# Patient Record
Sex: Female | Born: 1985 | Race: Black or African American | Hispanic: No | Marital: Single | Smoking: Former smoker
Health system: Southern US, Community
[De-identification: ages and names within clinical notes are randomized; demographics above are authoritative.]

## PROBLEM LIST (undated history)

## (undated) DIAGNOSIS — O1490 Unspecified pre-eclampsia, unspecified trimester: Secondary | ICD-10-CM

## (undated) DIAGNOSIS — IMO0001 Reserved for inherently not codable concepts without codable children: Secondary | ICD-10-CM

## (undated) DIAGNOSIS — R5383 Other fatigue: Secondary | ICD-10-CM

## (undated) DIAGNOSIS — R030 Elevated blood-pressure reading, without diagnosis of hypertension: Secondary | ICD-10-CM

## (undated) DIAGNOSIS — M5430 Sciatica, unspecified side: Secondary | ICD-10-CM

## (undated) DIAGNOSIS — J302 Other seasonal allergic rhinitis: Secondary | ICD-10-CM

## (undated) DIAGNOSIS — R011 Cardiac murmur, unspecified: Secondary | ICD-10-CM

## (undated) HISTORY — PX: TONSILLECTOMY: SUR1361

## (undated) HISTORY — DX: Other fatigue: R53.83

## (undated) HISTORY — DX: Reserved for inherently not codable concepts without codable children: IMO0001

## (undated) HISTORY — DX: Other seasonal allergic rhinitis: J30.2

## (undated) HISTORY — DX: Cardiac murmur, unspecified: R01.1

## (undated) HISTORY — DX: Unspecified pre-eclampsia, unspecified trimester: O14.90

## (undated) HISTORY — PX: LAPAROSCOPY: SHX197

## (undated) HISTORY — DX: Elevated blood-pressure reading, without diagnosis of hypertension: R03.0

## (undated) HISTORY — DX: Sciatica, unspecified side: M54.30

## (undated) HISTORY — PX: KNEE ARTHROSCOPY: SUR90

---

## 2006-04-18 ENCOUNTER — Emergency Department (HOSPITAL_COMMUNITY): Admission: EM | Admit: 2006-04-18 | Discharge: 2006-04-18 | Payer: Self-pay | Admitting: Emergency Medicine

## 2006-05-12 ENCOUNTER — Ambulatory Visit (HOSPITAL_COMMUNITY): Admission: RE | Admit: 2006-05-12 | Discharge: 2006-05-12 | Payer: Self-pay | Admitting: Obstetrics

## 2006-07-26 ENCOUNTER — Inpatient Hospital Stay (HOSPITAL_COMMUNITY): Admission: AD | Admit: 2006-07-26 | Discharge: 2006-07-26 | Payer: Self-pay | Admitting: Obstetrics & Gynecology

## 2006-09-22 ENCOUNTER — Inpatient Hospital Stay (HOSPITAL_COMMUNITY): Admission: AD | Admit: 2006-09-22 | Discharge: 2006-09-27 | Payer: Self-pay | Admitting: Obstetrics

## 2006-09-24 ENCOUNTER — Encounter (INDEPENDENT_AMBULATORY_CARE_PROVIDER_SITE_OTHER): Payer: Self-pay | Admitting: Specialist

## 2006-09-24 DIAGNOSIS — O1490 Unspecified pre-eclampsia, unspecified trimester: Secondary | ICD-10-CM

## 2007-04-13 ENCOUNTER — Emergency Department (HOSPITAL_COMMUNITY): Admission: EM | Admit: 2007-04-13 | Discharge: 2007-04-13 | Payer: Self-pay | Admitting: Family Medicine

## 2008-09-18 ENCOUNTER — Ambulatory Visit (HOSPITAL_COMMUNITY): Admission: RE | Admit: 2008-09-18 | Discharge: 2008-09-18 | Payer: Self-pay | Admitting: Obstetrics

## 2008-10-01 ENCOUNTER — Emergency Department (HOSPITAL_COMMUNITY): Admission: EM | Admit: 2008-10-01 | Discharge: 2008-10-01 | Payer: Self-pay | Admitting: Family Medicine

## 2008-10-29 ENCOUNTER — Emergency Department (HOSPITAL_COMMUNITY): Admission: EM | Admit: 2008-10-29 | Discharge: 2008-10-29 | Payer: Self-pay | Admitting: Family Medicine

## 2011-04-03 NOTE — Op Note (Signed)
Carla Patel, Carla Patel                 ACCOUNT NO.:  [id number]   MEDICAL RECORD NO.:  [id number]          PATIENT TYPE:  INP   LOCATION:  9113                          FACILITY:  [facility]   PHYSICIAN:  Charles A. Clearance Coots, M.D.DATE OF BIRTH:  [birth year]   DATE OF PROCEDURE:  09/24/2006  DATE OF DISCHARGE:  09/27/2006                                 OPERATIVE REPORT   PREOPERATIVE DIAGNOSES:  1. Term pregnancy.  2. Severe preeclampsia.  3. Failed induction of labor.   POSTOPERATIVE DIAGNOSES:  1. Term pregnancy.  2. Severe preeclampsia.  3. Failed induction of labor.   PROCEDURE:  Primary low transverse cesarean section.   SURGEON:  Coral Ceo, MD   ASSISTANT:  Antionette Char, MD   ANESTHESIA:  Spinal.   ESTIMATED BLOOD LOSS:  1000 mL.   COMPLICATIONS:  None.   DRAINS:  Foley to gravity.   FINDINGS:  A viable female, weight of 5 pounds 5 ounces, length of 19-1/4  inches.  Normal uterus, ovaries and fallopian tubes.   OPERATION:  The patient was brought to the operating room and after  satisfactory spinal anesthesia, the abdomen was prepped and draped in the  usual sterile fashion.  A Pfannenstiel skin incision was made with a scalpel  that was deepened down to the fascia with a scalpel.  The fascia was nicked  in the midline and the fascial incision was extended to the left and to the  right with curved Mayo scissors.  The superior and inferior fascial edges  were taken off of the rectus muscles with both blunt and sharp dissection.  The rectus muscle was bluntly and sharply divided in the midline and the  peritoneum was entered digitally and was digitally extended to the left and  to the right.  The bladder blade was positioned and the vesicouterine fold  of peritoneum above the reflection of the urinary bladder was grasped with  forceps and was incised and undermined with Metzenbaum scissors.  The  incision was extended to the left and to the right with the Metzenbaum  scissors.  The bladder flap was developed and the bladder blade was  repositioned in front of the urinary bladder, placing it well out of  operative field.  The uterus was then entered transversely in the lower  uterine segment with a scalpel; clear amniotic fluid was expelled.  The  uterine incision was extended to the left and to the right with the bandage  scissors.  The occiput was then brought up into the incision and the vertex  was delivered with the aid of fundal pressure from the assistant.  The  infant's mouth and nose were suctioned with a suction bulb and the delivery  was completed with the aid of fundal pressure from the assistant.  The  umbilical cord was doubly clamped and cut and the infant was handed off to  the nursery staff.  Cord blood was obtained.  The placenta was then  spontaneously expelled from the uterine cavity intact and submitted to  Pathology for evaluation.  The endometrial surface was thoroughly debrided  with a dry lap sponge.  The edges of the uterine incision were grasped with  ring forceps and the uterus was closed in 2 layers; the first layer was  closed with a continuous interlocking suture of 0 Monocryl; the second layer  was closed with a continuous imbricating suture of 0 Monocryl.  Hemostasis  was excellent.  The bladder flap was then closed with a continuous suture of  3-0 Monocryl.  The pelvic cavity was thoroughly irrigated with warm saline  solution.  All clots were removed.  The abdomen was then closed as follows:  The peritoneum was closed with a continuous suture of 2-0 Monocryl, the  muscle was approximated with interrupted sutures of 2-0 Monocryl, fascia was  closed with a continuous suture of 0 Vicryl, the subcutaneous tissue was  thoroughly irrigated with warm saline solution and all areas of subcutaneous  bleeding were coagulated with the Bovie.  The skin was then closed with a  continuous subcuticular suture of 3-0 Monocryl.  A sterile  bandage was  applied to the incision closure.  The surgical technician indicated that all  needle, sponge and instrument counts were correct x2.  The patient tolerated  the procedure well and  transported to the recovery room in satisfactory  condition.      Charles A. Clearance Coots, M.D.  Electronically Signed     CAH/MEDQ  D:  09/28/2006  T:  09/29/2006  Job:  [id number]

## 2011-04-03 NOTE — Discharge Summary (Signed)
Carla Patel, MALSON NO.:  [id number]   MEDICAL RECORD NO.:  [id number]          PATIENT TYPE:  INP   LOCATION:  9113                          FACILITY:  [facility]   PHYSICIAN:  Roseanna Rainbow, M.D.DATE OF BIRTH:  1986-08-07   DATE OF ADMISSION:  09/22/2006  DATE OF DISCHARGE:  09/27/2006                               DISCHARGE SUMMARY   CHIEF COMPLAINT:  The patient is a 25 year old, G1, African American  female with an estimated date of confinement of November17,2007,  complaining of a headache, visual disturbances and swelling of her  extremities.   HISTORY OF PRESENT ILLNESS:  Please see the above.  She also reports  irregular uterine contractions.   ALLERGIES:  No known drug allergies.   MEDICATIONS:  Prenatal vitamins.   PAST MEDICAL HISTORY:  She denies past medical history.   SOCIAL HISTORY:  She reports current tobacco use.  She has a history of  alcohol use and she denies substance abuse.   FAMILY HISTORY:  Coronary vascular disease, diabetes, hypertension.   PHYSICAL EXAMINATION:  VITAL SIGNS:  Temperature 97.8, pulse 95, blood  pressure 176/114.  GENERAL:  Obese, African American female in no apparent distress.  HEENT:  Normocephalic, atraumatic.  NECK:  Supple.  LUNGS:  Clear to auscultation bilaterally.  HEART:  Regular rate and rhythm.  ABDOMEN:  Gravid, nontender.  PELVIC:  External female genitalia are normal appearing.  Speculum exam  shows the vagina is clean, the cervix is closed, 50% effaced with vertex  at a -3 on digital exam.  EXTREMITIES:  There is 1+ pretibial pitting edema.  SKIN:  No rash.   ASSESSMENT:  1. Intrauterine pregnancy at 38+ weeks.  2. Severe pregnancy-induced hypertension.   PLAN:  Admission with PIH labs, magnesium sulfate seizure prophylaxis.   HOSPITAL COURSE:  The patient was admitted.  The cervix was ripened with  a Foley bulb.  The Foley bulb was expelled and the cervix was found to  be 3 cm  dilated.  The membranes were artificially ruptured.  Internal  monitors were placed.  However, she did not progress beyond the latent  phase of labor and the decision was made to proceed with a cesarean  delivery.  Please see the dictated operative summary.  Postoperatively,  her antihypertensive regimen was titrated.  A diuretic was added.  Her  blood pressures were controlled and she was discharged to home on postop  day #3, tolerating a regular diet.   DISCHARGE DIAGNOSES:  1. Severe pregnancy-induced hypertension at 38+ weeks.  2. Failed induction of labor.   PROCEDURE:  Cesarean delivery.   CONDITION ON DISCHARGE:  Stable.   DIET:  Regular.   ACTIVITY:  Progressive activity, pelvic rest.   DISCHARGE MEDICATIONS:  Percocet, ibuprofen, Micronor, prenatal  vitamins, labetalol and hydrochlorothiazide.   DISPOSITION:  The patient was to follow up in the office in 2 days.      Roseanna Rainbow, M.D.  Electronically Signed     LAJ/MEDQ  D:  10/19/2006  T:  10/19/2006  Job:  [id number]

## 2011-08-18 LAB — POCT H PYLORI SCREEN: H. PYLORI SCREEN, POC: NEGATIVE

## 2011-08-21 LAB — POCT I-STAT, CHEM 8
Calcium, Ion: 1.17 mmol/L (ref 1.12–1.32)
Chloride: 106 mEq/L (ref 96–112)
HCT: 42 % (ref 36.0–46.0)
Hemoglobin: 14.3 g/dL (ref 12.0–15.0)
Potassium: 3.8 mEq/L (ref 3.5–5.1)

## 2015-02-04 ENCOUNTER — Ambulatory Visit: Payer: Self-pay | Admitting: Cardiology

## 2015-03-20 ENCOUNTER — Ambulatory Visit: Payer: Self-pay | Admitting: Cardiology

## 2015-04-10 ENCOUNTER — Encounter: Payer: Self-pay | Admitting: Cardiology

## 2016-03-27 DIAGNOSIS — I10 Essential (primary) hypertension: Secondary | ICD-10-CM | POA: Insufficient documentation

## 2016-05-15 ENCOUNTER — Encounter (HOSPITAL_COMMUNITY): Payer: Self-pay | Admitting: Physical Medicine and Rehabilitation

## 2016-05-15 ENCOUNTER — Emergency Department (HOSPITAL_COMMUNITY): Payer: BLUE CROSS/BLUE SHIELD

## 2016-05-15 ENCOUNTER — Emergency Department (HOSPITAL_COMMUNITY)
Admission: EM | Admit: 2016-05-15 | Discharge: 2016-05-15 | Disposition: A | Discharge status: Home / Self Care | Payer: BLUE CROSS/BLUE SHIELD | Attending: Emergency Medicine | Admitting: Emergency Medicine

## 2016-05-15 DIAGNOSIS — Z79899 Other long term (current) drug therapy: Secondary | ICD-10-CM | POA: Insufficient documentation

## 2016-05-15 DIAGNOSIS — M25561 Pain in right knee: Secondary | ICD-10-CM | POA: Diagnosis present

## 2016-05-15 MED ORDER — OXYCODONE-ACETAMINOPHEN 5-325 MG PO TABS: 1.0000 | ORAL_TABLET | Freq: Four times a day (QID) | ORAL | Status: DC | PRN

## 2016-05-15 MED ORDER — OXYCODONE-ACETAMINOPHEN 5-325 MG PO TABS
1.0000 | ORAL_TABLET | Freq: Once | ORAL | Status: AC
  Administered 2016-05-15: 1 via ORAL
  Filled 2016-05-15: qty 1

## 2016-05-15 MED ORDER — NAPROXEN 250 MG PO TABS
500.0000 mg | ORAL_TABLET | Freq: Once | ORAL | Status: AC
  Administered 2016-05-15: 500 mg via ORAL
  Filled 2016-05-15: qty 2

## 2016-05-15 MED ORDER — NAPROXEN 500 MG PO TABS: 500.0000 mg | ORAL_TABLET | Freq: Two times a day (BID) | ORAL | Status: DC

## 2016-05-15 NOTE — ED Provider Notes (Signed)
CSN: [id number]     Arrival date & time 05/15/16  1112 History  By signing my name below, I, Carla Patel, attest that this documentation has been prepared under the direction and in the presence of Carla Patel, [place]. Electronically Signed: Evon Slackerrance Patel, ED Scribe. 05/15/2016. 11:30 AM.    Chief Complaint  Patient presents with  . Knee Pain   Patient is a [age] y.o. female presenting with knee pain. The history is provided by the patient. No language interpreter was used.  Knee Pain  HPI Comments: Carla Patel is a 30 y.o. female who presents to the Emergency Department complaining of right knee pain onset 1 day prior. Pt presents with associated swelling. Pt states that she was exercising on the ground and while she was standing she felt the knee pop. Pt states that the pain is worse when bearing weight and moving the knee. Pt states that she has tried an epsom salt bath with no relief. Pt doesn't report numbness or tingling.   Past Medical History  Diagnosis Date  . Cardiac murmur   . Pre-eclampsia   . Seasonal allergies   . Elevated BP   . Fatigue   . Sciatica    Past Surgical History  Procedure Laterality Date  . Tonsillectomy     No family history on file. Social History  Substance Use Topics  . Smoking status: Never Smoker   . Smokeless tobacco: None  . Alcohol Use: None   OB History    No data available      Review of Systems  Musculoskeletal: Positive for joint swelling and arthralgias.  Neurological: Negative for numbness.     Allergies  Review of patient's allergies indicates no known allergies.  Home Medications   Prior to Admission medications   Medication Sig Start Date End Date Taking? Authorizing Provider  fluticasone (FLONASE) 50 MCG/ACT nasal spray Place 2 sprays into both nostrils daily.    Historical Provider, MD  Levonorgestrel-Ethinyl Estradiol (AMETHIA,CAMRESE) 0.15-0.03 &0.01 MG tablet Take 1 tablet by mouth daily.    Historical  Provider, MD   BP 155/117 mmHg  Pulse 88  Temp(Src) 97.5 F (36.4 C) (Oral)  Resp 18  Ht 5\' 8"  (1.727 m)  Wt 230 lb (104.327 kg)  BMI 34.98 kg/m2  SpO2 99%  LMP 04/04/2016   Physical Exam  Constitutional: She is oriented to person, place, and time. She appears well-developed and well-nourished. No distress.  HENT:  Head: Normocephalic and atraumatic.  Eyes: Conjunctivae and EOM are normal.  Neck: Neck supple. No tracheal deviation present.  Cardiovascular: Normal rate.   Pulmonary/Chest: Effort normal. No respiratory distress.  Musculoskeletal: Normal range of motion.  Right knee with diffuse anterior tenderness. Palpable lateroanterior effusion. Limited active ROM 2/2 pain. Full passive ROM. No laxity or crepitus. Negative anterior drawer. 2+ distal pulses. Brisk cap refill. Unable to ambulate due to pain.  Neurological: She is alert and oriented to person, place, and time.  Skin: Skin is warm and dry.  Psychiatric: She has a normal mood and affect. Her behavior is normal.  Nursing note and vitals reviewed.   ED Course  Procedures (including critical care time) DIAGNOSTIC STUDIES: Oxygen Saturation is 99% on RA, normal by my interpretation.    COORDINATION OF CARE: 11:30 AM-Discussed treatment plan which includes right knee X-ray with pt at bedside and pt agreed to plan.     Labs Review Labs Reviewed - No data to display  Imaging Review Dg Knee Complete  4 Views Right  05/15/2016  CLINICAL DATA:  RIGHT knee injury yesterday at the gym, having RIGHT knee pain, unable to bear weight, pain after exercise yesterday EXAM: RIGHT KNEE - COMPLETE 4+ VIEW COMPARISON:  None FINDINGS: Osseous mineralization normal. Joint spaces preserved. Probable joint effusion. No acute fracture, dislocation, or bone destruction. IMPRESSION: Probable RIGHT knee joint effusion without acute fracture dislocation. Electronically Signed   By: Ulyses SouthwardMark  Boles M.D.   On: 05/15/2016 12:06      EKG  Interpretation None      MDM   Final diagnoses:  Right knee pain    X-ray with no acute fracture or dislocation. There is a probably effusion. Pain improved in the ED. Pt is worried about ligamentous/menical injury. I did explain to her although x-ray is limited in diagnostic ability there is no indication for emergent knee MRI today. She is otherwise neurovascularly intact. We will place in knee brace and provide crutches. Rx for supportive meds given. Encouraged RICE therapy and close f/u with ortho. ER return precautions given.     Carla CoriaSerena Y Lindzy Rupert, PA-C 05/15/16 1410  Carla SouSam Jacubowitz, MD 05/15/16 [phone]

## 2016-05-15 NOTE — Discharge Instructions (Signed)
Your x-ray today showed a bit of swelling but no fracture or dislocation. We gave you a knee brace and crutches. At home, keep you leg elevated and ice as needed. Take medications as prescribed as needed for pain. Please follow up with orthopedics as soon as possible. Return to the ER for new or worsening symptoms.

## 2016-05-15 NOTE — ED Notes (Signed)
Pt reports R sided knee pain. States she heard something "pop" last night while exercising. Now states increased pain today.

## 2017-04-25 ENCOUNTER — Ambulatory Visit (HOSPITAL_COMMUNITY)
Admission: EM | Admit: 2017-04-25 | Discharge: 2017-04-25 | Disposition: A | Discharge status: Home / Self Care | Payer: Medicaid Other | Attending: Internal Medicine | Admitting: Internal Medicine

## 2017-04-25 ENCOUNTER — Encounter (HOSPITAL_COMMUNITY): Payer: Self-pay | Admitting: Emergency Medicine

## 2017-04-25 DIAGNOSIS — J069 Acute upper respiratory infection, unspecified: Secondary | ICD-10-CM | POA: Diagnosis not present

## 2017-04-25 MED ORDER — IPRATROPIUM BROMIDE 0.06 % NA SOLN: 2.0000 | Freq: Four times a day (QID) | NASAL | 0 refills | Status: DC

## 2017-04-25 MED ORDER — BENZONATATE 100 MG PO CAPS: 100.0000 mg | ORAL_CAPSULE | Freq: Three times a day (TID) | ORAL | 0 refills | Status: DC

## 2017-04-25 NOTE — ED Provider Notes (Signed)
CSN: [id number]     Arrival date & time 04/25/17  1512 History   First MD Initiated Contact with Patient 04/25/17 1642     Chief Complaint  Patient presents with  . Otalgia   (Consider location/radiation/quality/duration/timing/severity/associated sxs/prior Treatment) The history is provided by the patient.  URI  Presenting symptoms: congestion, cough, ear pain, fatigue, rhinorrhea and sore throat   Presenting symptoms: no fever   Congestion:    Location:  Nasal   Interferes with sleep: yes     Interferes with eating/drinking: no   Severity:  Moderate Onset quality:  Gradual Duration:  3 days Timing:  Constant Progression:  Worsening Chronicity:  New Relieved by:  None tried Worsened by:  Nothing Ineffective treatments:  None tried Associated symptoms: headaches, myalgias and sneezing   Associated symptoms: no arthralgias, no neck pain, no sinus pain, no swollen glands and no wheezing     Past Medical History:  Diagnosis Date  . Cardiac murmur   . Elevated BP   . Fatigue   . Pre-eclampsia   . Sciatica   . Seasonal allergies    Past Surgical History:  Procedure Laterality Date  . TONSILLECTOMY     History reviewed. No pertinent family history. Social History  Substance Use Topics  . Smoking status: Never Smoker  . Smokeless tobacco: Not on file  . Alcohol use Not on file   OB History    No data available     Review of Systems  Constitutional: Positive for chills and fatigue. Negative for appetite change and fever.  HENT: Positive for congestion, ear pain, rhinorrhea, sneezing and sore throat. Negative for sinus pain.   Respiratory: Positive for cough. Negative for wheezing.   Cardiovascular: Negative for chest pain and palpitations.  Gastrointestinal: Negative for constipation, diarrhea, nausea and vomiting.  Musculoskeletal: Positive for myalgias. Negative for arthralgias and neck pain.  Skin: Negative.   Neurological: Positive for headaches. Negative for  light-headedness.    Allergies  Patient has no known allergies.  Home Medications   Prior to Admission medications   Medication Sig Start Date End Date Taking? Authorizing Provider  benzonatate (TESSALON) 100 MG capsule Take 1 capsule (100 mg total) by mouth every 8 (eight) hours. 04/25/17   Dorena BodoKennard, Adalind Weitz, NP  ipratropium (ATROVENT) 0.06 % nasal spray Place 2 sprays into both nostrils 4 (four) times daily. 04/25/17   Dorena BodoKennard, Timiyah Romito, NP   Meds Ordered and Administered this Visit  Medications - No data to display  BP (!) 160/109 (BP Location: Right Arm) Comment: nortified rn  Pulse 88   Temp 98.7 F (37.1 C) (Oral)   Resp 16   SpO2 96%  No data found.   Physical Exam  Constitutional: She is oriented to person, place, and time. She appears well-developed and well-nourished. She does not have a sickly appearance. She does not appear ill. No distress.  HENT:  Head: Normocephalic and atraumatic.  Right Ear: Tympanic membrane and external ear normal.  Left Ear: Tympanic membrane and external ear normal.  Nose: Nose normal. Right sinus exhibits no maxillary sinus tenderness and no frontal sinus tenderness. Left sinus exhibits no maxillary sinus tenderness and no frontal sinus tenderness.  Mouth/Throat: Uvula is midline and oropharynx is clear and moist. No oropharyngeal exudate.  Eyes: Conjunctivae are normal.  Neck: Normal range of motion. Neck supple. No JVD present.  Cardiovascular: Normal rate and regular rhythm.   Pulmonary/Chest: Effort normal and breath sounds normal. No respiratory distress. She has no wheezes.  Abdominal: Soft. Bowel sounds are normal. She exhibits no distension. There is no tenderness. There is no guarding.  Lymphadenopathy:    She has no cervical adenopathy.  Neurological: She is alert and oriented to person, place, and time.  Skin: Skin is warm and dry. Capillary refill takes less than 2 seconds. She is not diaphoretic.  Psychiatric: She has a  normal mood and affect. Her behavior is normal.  Nursing note and vitals reviewed.   Urgent Care Course     Procedures (including critical care time)  Labs Review Labs Reviewed - No data to display  Imaging Review No results found.     MDM   1. Viral upper respiratory tract infection    Most likely viral upper respiratory infection. Given Tessalon for cough, ipratropium for runny nose, provided counseling on over-the-counter therapies for symptom management. Contact [facility] to establish for primary care. Otherwise return to clinic in one week as needed.      Dorena Bodo, NP 04/25/17 [phone]

## 2017-04-25 NOTE — Discharge Instructions (Signed)
You most likely have a viral URI, I advise rest, plenty of fluids and management of symptoms with over the counter medicines. For symptoms you may take Tylenol as needed every 4-6 hours for body aches or fever, not to exceed 4,000 mg a day, Take mucinex or mucinex DM ever 12 hours with a full glass of water, you may use an inhaled steroid such as Flonase, 2 sprays each nostril once a day for congestion, or an antihistamine such as Claritin or Zyrtec once a day. For cough, I have prescribed a medication called Tessalon. Take 1 tablet every 8 hours as needed for your cough. For runny nose and congestion, I have prescribed ipratropium nasal spray, due to sprays each nostril up to 4 times a day as needed. Should your symptoms worsen or fail to resolve, follow up with your primary care provider or return to clinic.

## 2017-04-25 NOTE — ED Triage Notes (Signed)
[facility] a [facility] of [facility] , bilateral otalgia, headache and fatigue x 3 days.

## 2018-02-16 ENCOUNTER — Ambulatory Visit: Payer: Self-pay | Admitting: Family Medicine

## 2018-03-03 ENCOUNTER — Other Ambulatory Visit: Payer: Self-pay | Admitting: Nurse Practitioner

## 2018-03-03 DIAGNOSIS — Z1231 Encounter for screening mammogram for malignant neoplasm of breast: Secondary | ICD-10-CM

## 2018-03-30 ENCOUNTER — Ambulatory Visit: Payer: Medicaid Other

## 2018-06-28 IMAGING — DX DG KNEE COMPLETE 4+V*R*
4 series · 4 of 4 positions shown · non-contrast
Comparison: None

CLINICAL DATA: RIGHT knee injury yesterday at the gym, having RIGHT
knee pain, unable to bear weight, pain after exercise yesterday

EXAM:
RIGHT KNEE - COMPLETE 4+ VIEW

[knee ap]
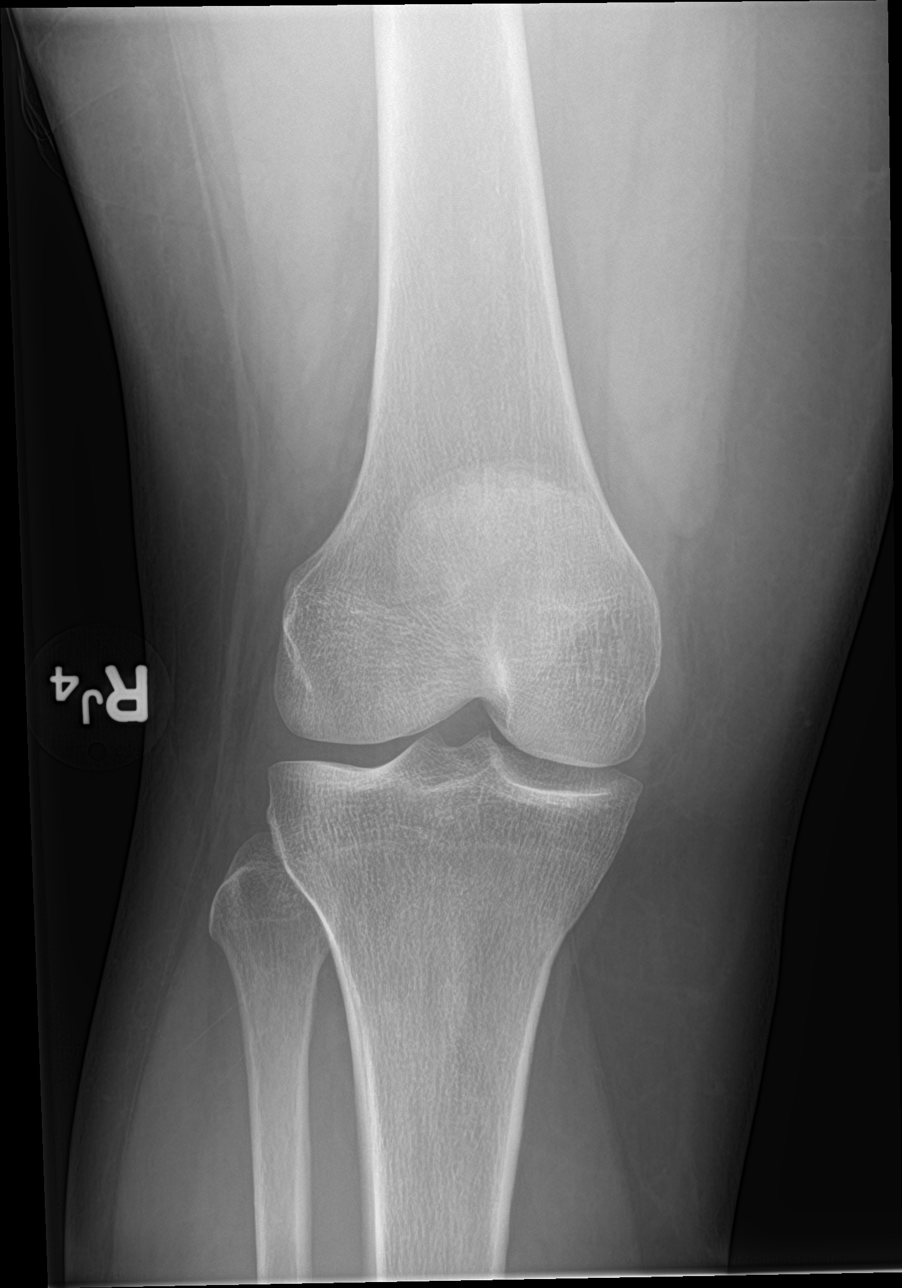

[knee lat]
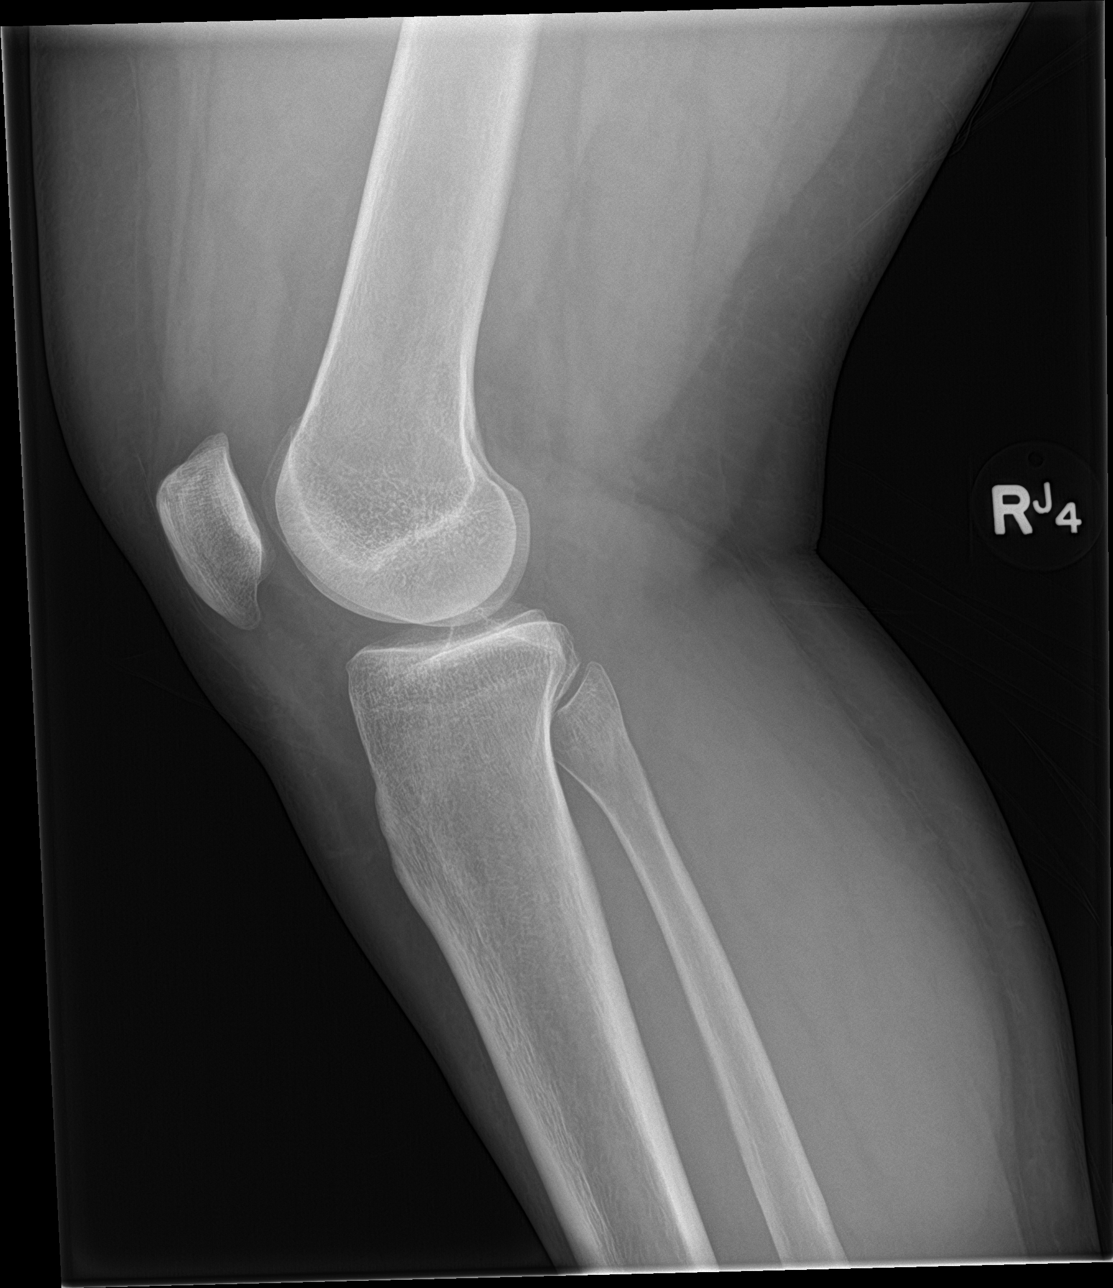

[knee obl (1 of 2)]
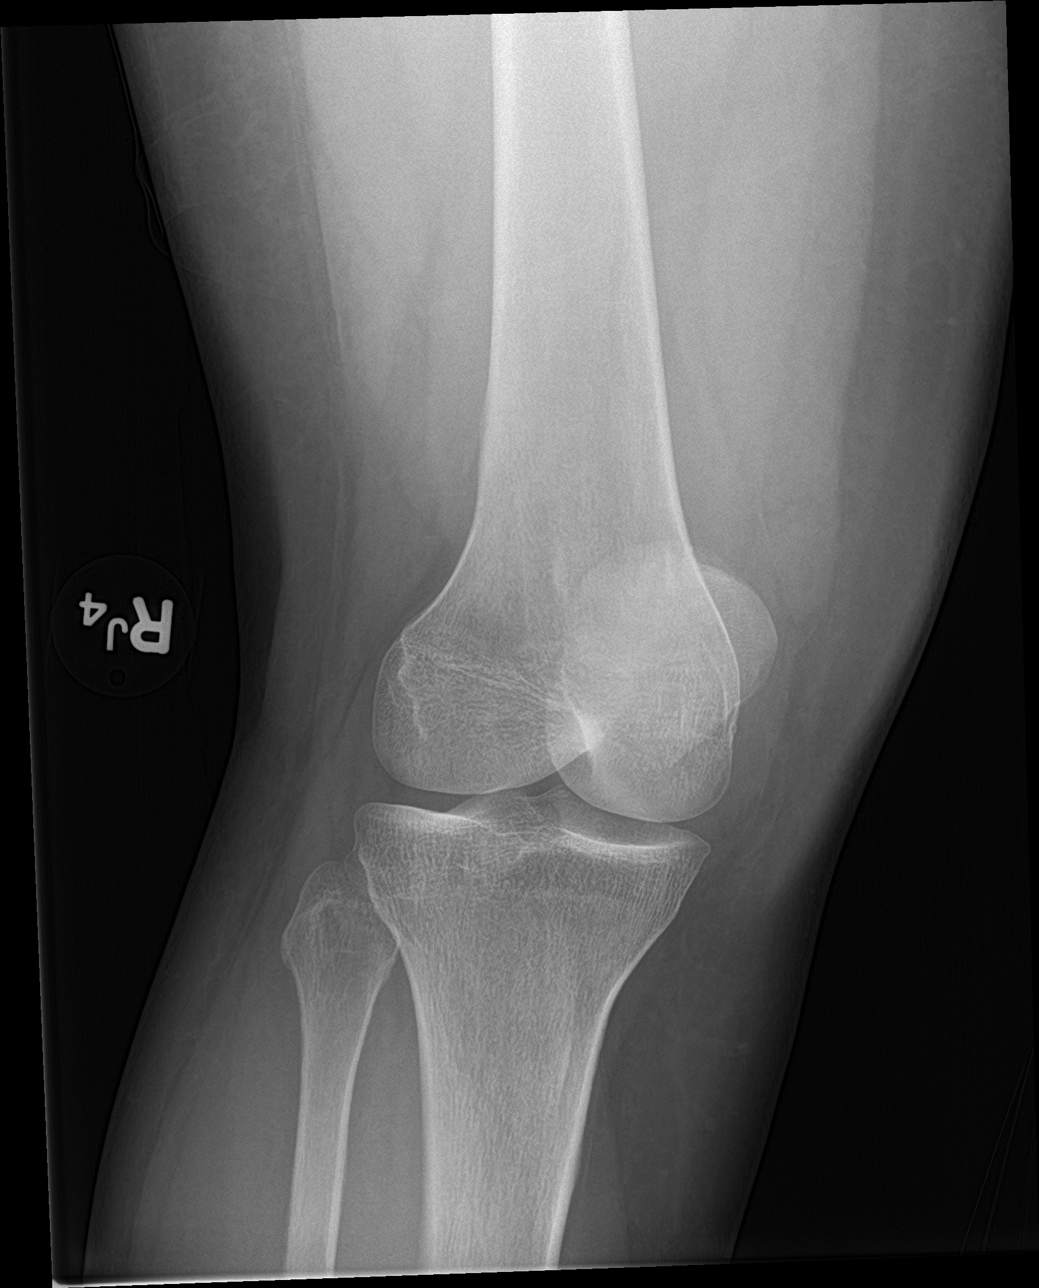

[knee obl (2 of 2)]
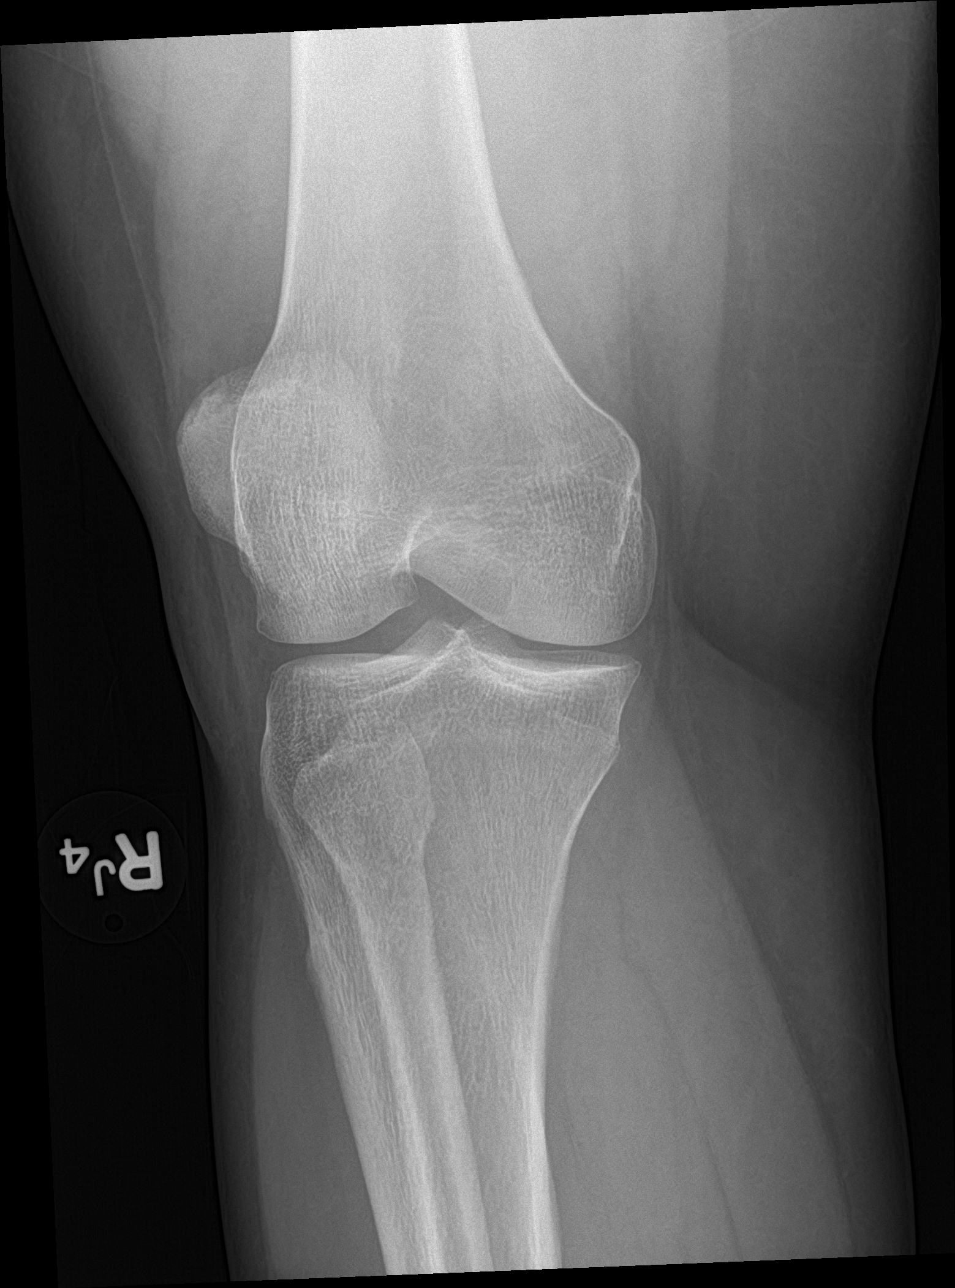

[4 of 4 positions shown; findings below may reference images not displayed]

FINDINGS: Osseous mineralization normal.

Joint spaces preserved.

Probable joint effusion.

No acute fracture, dislocation, or bone destruction.
IMPRESSION: Probable RIGHT knee joint effusion without acute fracture
dislocation.

## 2018-07-27 DIAGNOSIS — N920 Excessive and frequent menstruation with regular cycle: Secondary | ICD-10-CM | POA: Insufficient documentation

## 2019-10-18 ENCOUNTER — Inpatient Hospital Stay (HOSPITAL_COMMUNITY): Payer: Medicaid Other

## 2019-10-18 ENCOUNTER — Inpatient Hospital Stay (HOSPITAL_COMMUNITY)
Admission: AD | Admit: 2019-10-18 | Discharge: 2019-10-18 | Disposition: A | Discharge status: Home / Self Care | Payer: Medicaid Other | Attending: Obstetrics & Gynecology | Admitting: Obstetrics & Gynecology

## 2019-10-18 ENCOUNTER — Encounter (HOSPITAL_COMMUNITY): Payer: Self-pay

## 2019-10-18 ENCOUNTER — Other Ambulatory Visit: Payer: Self-pay

## 2019-10-18 DIAGNOSIS — O99011 Anemia complicating pregnancy, first trimester: Secondary | ICD-10-CM | POA: Diagnosis not present

## 2019-10-18 DIAGNOSIS — O3680X0 Pregnancy with inconclusive fetal viability, not applicable or unspecified: Secondary | ICD-10-CM

## 2019-10-18 DIAGNOSIS — O09521 Supervision of elderly multigravida, first trimester: Secondary | ICD-10-CM | POA: Diagnosis not present

## 2019-10-18 DIAGNOSIS — Z3A01 Less than 8 weeks gestation of pregnancy: Secondary | ICD-10-CM | POA: Diagnosis not present

## 2019-10-18 DIAGNOSIS — O4690 Antepartum hemorrhage, unspecified, unspecified trimester: Secondary | ICD-10-CM

## 2019-10-18 DIAGNOSIS — Z79899 Other long term (current) drug therapy: Secondary | ICD-10-CM | POA: Insufficient documentation

## 2019-10-18 DIAGNOSIS — O4691 Antepartum hemorrhage, unspecified, first trimester: Secondary | ICD-10-CM

## 2019-10-18 DIAGNOSIS — Z87891 Personal history of nicotine dependence: Secondary | ICD-10-CM | POA: Insufficient documentation

## 2019-10-18 DIAGNOSIS — O2391 Unspecified genitourinary tract infection in pregnancy, first trimester: Secondary | ICD-10-CM | POA: Diagnosis present

## 2019-10-18 DIAGNOSIS — B9689 Other specified bacterial agents as the cause of diseases classified elsewhere: Secondary | ICD-10-CM | POA: Insufficient documentation

## 2019-10-18 DIAGNOSIS — N939 Abnormal uterine and vaginal bleeding, unspecified: Secondary | ICD-10-CM

## 2019-10-18 LAB — CBC
HCT: 31.5 % — ABNORMAL LOW (ref 36.0–46.0)
Hemoglobin: 9.8 g/dL — ABNORMAL LOW (ref 12.0–15.0)
MCH: 20 pg — ABNORMAL LOW (ref 26.0–34.0)
MCHC: 31.1 g/dL (ref 30.0–36.0)
MCV: 64.4 fL — ABNORMAL LOW (ref 80.0–100.0)
Platelets: 346 10*3/uL (ref 150–400)
RBC: 4.89 MIL/uL (ref 3.87–5.11)
RDW: 18.4 % — ABNORMAL HIGH (ref 11.5–15.5)
WBC: 7.5 10*3/uL (ref 4.0–10.5)
nRBC: 0 % (ref 0.0–0.2)

## 2019-10-18 LAB — WET PREP, GENITAL
Sperm: NONE SEEN
Trich, Wet Prep: NONE SEEN
Yeast Wet Prep HPF POC: NONE SEEN

## 2019-10-18 LAB — URINALYSIS, ROUTINE W REFLEX MICROSCOPIC
Bacteria, UA: NONE SEEN
Bilirubin Urine: NEGATIVE
Glucose, UA: NEGATIVE mg/dL
Ketones, ur: NEGATIVE mg/dL
Leukocytes,Ua: NEGATIVE
Nitrite: NEGATIVE
Protein, ur: 100 mg/dL — AB
Specific Gravity, Urine: 1.024 (ref 1.005–1.030)
pH: 6 (ref 5.0–8.0)

## 2019-10-18 LAB — ABO/RH: ABO/RH(D): B POS

## 2019-10-18 LAB — HCG, QUANTITATIVE, PREGNANCY: hCG, Beta Chain, Quant, S: 14 m[IU]/mL — ABNORMAL HIGH (ref <5)

## 2019-10-18 LAB — POCT PREGNANCY, URINE: Preg Test, Ur: POSITIVE — AB

## 2019-10-18 MED ORDER — INTEGRA 62.5-62.5-40-3 MG PO CAPS: 1.0000 | ORAL_CAPSULE | Freq: Every day | ORAL | 3 refills | Status: DC

## 2019-10-18 MED ORDER — METRONIDAZOLE 500 MG PO TABS: 500.0000 mg | ORAL_TABLET | Freq: Two times a day (BID) | ORAL | 0 refills | Status: DC

## 2019-10-18 MED ORDER — PREPLUS 27-1 MG PO TABS: 1.0000 | ORAL_TABLET | Freq: Every day | ORAL | 13 refills | Status: DC

## 2019-10-18 NOTE — MAU Provider Note (Signed)
History     CSN: [id number]  Arrival date and time: 10/18/19 1107   First Provider Initiated Contact with Patient 10/18/19 1229      Chief Complaint  Patient presents with  . Vaginal Bleeding   HPI Carla Patel is a 33 y.o. G2P0 at [redacted]w[redacted]d by LMP who presents to MAU with chief complaint of painless bleeding in early pregnancy. She reports LMP of 10/02/19 with seven days of bleeding. Then her bleeding restarted around 11/25 and she has continued to bleed since that time. Her bleeding is bright red, does not saturate a pad, and she is otherwise asymptomatic. Most recent intercourse one week ago. She denies abdominal tenderness, dysuria, weakness, fever or recent illness.  OB History    Gravida  2   Para      Term      Preterm      AB      Living  1     SAB      TAB      Ectopic      Multiple      Live Births  1           Past Medical History:  Diagnosis Date  . Elevated BP   . Fatigue   . Pre-eclampsia    takes meds  . Sciatica   . Seasonal allergies     Past Surgical History:  Procedure Laterality Date  . CESAREAN SECTION    . KNEE ARTHROSCOPY    . TONSILLECTOMY      No family history on file.  Social History   Tobacco Use  . Smoking status: Former Smoker    Types: Cigars  . Smokeless tobacco: Never Used  Substance Use Topics  . Alcohol use: Not Currently    Alcohol/week: 0.0 standard drinks  . Drug use: Never    Allergies: No Known Allergies  Medications Prior to Admission  Medication Sig Dispense Refill Last Dose  . benzonatate (TESSALON) 100 MG capsule Take 1 capsule (100 mg total) by mouth every 8 (eight) hours. 21 capsule 0   . ipratropium (ATROVENT) 0.06 % nasal spray Place 2 sprays into both nostrils 4 (four) times daily. 15 mL 0     Review of Systems  Constitutional: Negative for fever.  Respiratory: Negative for shortness of breath.   Gastrointestinal: Negative for abdominal pain.  Genitourinary: Positive for vaginal  bleeding. Negative for difficulty urinating and dysuria.  Musculoskeletal: Negative for back pain.  All other systems reviewed and are negative.  Physical Exam   Blood pressure (!) 141/90, pulse 89, temperature 98.7 F (37.1 C), temperature source Oral, resp. rate 18, height 5\' 8"  (1.727 m), weight 113 kg, last menstrual period 10/02/2019, SpO2 100 %.  Physical Exam  Nursing note and vitals reviewed. Constitutional: She is oriented to person, place, and time. She appears well-developed and well-nourished.  Cardiovascular: Normal rate.  Respiratory: Effort normal and breath sounds normal.  GI: Soft. She exhibits no distension. There is no abdominal tenderness. There is no rebound and no guarding.  Genitourinary:    Vaginal discharge present.     Genitourinary Comments: No overt bleeding visualized on SSE. Scant amount of mucoid, brown-tinged discharge. Bimanual not performed   Neurological: She is alert and oriented to person, place, and time.  Skin: Skin is warm and dry.  Psychiatric: She has a normal mood and affect. Her behavior is normal. Judgment and thought content normal.    MAU Course/MDM  Procedures: sterile speculum exam,  ultrasound  --Discussed differential diagnosis of very early pregnancy vs non-viable pregnancy. Explained possible plans of care based on changes observed on repeat Quant hCG. Patient and partner verbalized understanding  Patient Vitals for the past 24 hrs:  BP Temp Temp src Pulse Resp SpO2 Height Weight  10/18/19 1449 135/83 99.3 F (37.4 C) Oral 87 20 - - -  10/18/19 1228 (!) 141/90 - - 89 - - - -  10/18/19 1226 (!) 158/94 98.7 F (37.1 C) Oral 95 18 100 % - -  10/18/19 1200 - - - - - - 5\' 8"  (1.727 m) 113 kg   Results for orders placed or performed during the hospital encounter of 10/18/19 (from the past 24 hour(s))  Pregnancy, urine POC     Status: Abnormal   Collection Time: 10/18/19 11:43 AM  Result Value Ref Range   Preg Test, Ur POSITIVE (A)  NEGATIVE  Urinalysis, Routine w reflex microscopic     Status: Abnormal   Collection Time: 10/18/19 12:03 PM  Result Value Ref Range   Color, Urine YELLOW YELLOW   APPearance HAZY (A) CLEAR   Specific Gravity, Urine 1.024 1.005 - 1.030   pH 6.0 5.0 - 8.0   Glucose, UA NEGATIVE NEGATIVE mg/dL   Hgb urine dipstick LARGE (A) NEGATIVE   Bilirubin Urine NEGATIVE NEGATIVE   Ketones, ur NEGATIVE NEGATIVE mg/dL   Protein, ur 14/02/20 (A) NEGATIVE mg/dL   Nitrite NEGATIVE NEGATIVE   Leukocytes,Ua NEGATIVE NEGATIVE   RBC / HPF 21-50 0 - 5 RBC/hpf   WBC, UA 0-5 0 - 5 WBC/hpf   Bacteria, UA NONE SEEN NONE SEEN   Squamous Epithelial / LPF 0-5 0 - 5   Mucus PRESENT   Wet prep, genital     Status: Abnormal   Collection Time: 10/18/19 12:37 PM  Result Value Ref Range   Yeast Wet Prep HPF POC NONE SEEN NONE SEEN   Trich, Wet Prep NONE SEEN NONE SEEN   Clue Cells Wet Prep HPF POC PRESENT (A) NONE SEEN   WBC, Wet Prep HPF POC MODERATE (A) NONE SEEN   Sperm NONE SEEN   hCG, quantitative, pregnancy     Status: Abnormal   Collection Time: 10/18/19 12:45 PM  Result Value Ref Range   hCG, Beta Chain, Quant, S 14 (H) <5 mIU/mL  ABO/Rh     Status: None   Collection Time: 10/18/19 12:45 PM  Result Value Ref Range   ABO/RH(D) B POS    No rh immune globuloin      NOT A RH IMMUNE GLOBULIN CANDIDATE, PT RH POSITIVE Performed at [facility], [address] [place], [place]   CBC     Status: Abnormal   Collection Time: 10/18/19 12:45 PM  Result Value Ref Range   WBC 7.5 4.0 - 10.5 K/uL   RBC 4.89 3.87 - 5.11 MIL/uL   Hemoglobin 9.8 (L) 12.0 - 15.0 g/dL   HCT 14/02/20 (L) 52.7 - 78.2 %   MCV 64.4 (L) 80.0 - 100.0 fL   MCH 20.0 (L) 26.0 - 34.0 pg   MCHC 31.1 30.0 - 36.0 g/dL   RDW 42.3 (H) 53.6 - 14.4 %   Platelets 346 150 - 400 K/uL   nRBC 0.0 0.0 - 0.2 %   31.5 Ob Less Than 14 Weeks With Ob Transvaginal  Result Date: 10/18/2019 CLINICAL DATA:  Vaginal bleeding in first trimester of  pregnancy EXAM: OBSTETRIC <14 WK 14/12/2018 AND TRANSVAGINAL OB US TECHNIQUE:  Both transabdominal and transvaginal ultrasound examinations were performed for complete evaluation of the gestation as well as the maternal uterus, adnexal regions, and pelvic cul-de-sac. Transvaginal technique was performed to assess early pregnancy. COMPARISON:  None for this gestation FINDINGS: Intrauterine gestational sac: Absent Yolk sac:  N/A Embryo:  N/A Cardiac Activity: N/A Heart Rate: N/A  bpm MSD:   mm    w     d CRL:    mm    w    d                  US EDC: Subchorionic hemorrhage:  N/A . Maternal uterus/adnexae: Retroverted uterus with mildly heterogeneous myometrium. No uterine mass or gestational sac identified. Endometrial complex unremarkable without fluid or gestational sac. RIGHT ovary normal size and morphology, 1.3 x 2.0 x 1.3 cm. LEFT ovary normal size and morphology, 2.7 x 1.1 x 1.5 cm. Trace free pelvic fluid. No adnexal masses. IMPRESSION: No intrauterine gestation identified. Findings are compatible with pregnancy of unknown location. Differential diagnosis includes early intrauterine pregnancy too early to visualize, spontaneous abortion, and ectopic pregnancy. Serial quantitative beta hCG and or followup ultrasound recommended to definitively exclude ectopic pregnancy. Electronically Signed   By: Ulyses SouthwardMark  Boles M.D.   On: 10/18/2019 14:25   Meds ordered this encounter  Medications  . Prenatal Vit-Fe Fumarate-FA (PREPLUS) 27-1 MG TABS    Sig: Take 1 tablet by mouth daily.    Dispense:  30 tablet    Refill:  13    Order Specific Question:   Supervising Provider    Answer:   Nicholaus BloomVE, MYRA C [[id number]]  . metroNIDAZOLE (FLAGYL) 500 MG tablet    Sig: Take 1 tablet (500 mg total) by mouth 2 (two) times daily.    Dispense:  14 tablet    Refill:  0    Order Specific Question:   Supervising Provider    Answer:   DOVE, MYRA C [[id number]]  . Fe Fum-FePoly-Vit C-Vit B3 (INTEGRA) 62.5-62.5-40-3 MG CAPS    Sig: Take 1 tablet by  mouth daily.    Dispense:  30 capsule    Refill:  3    Order Specific Question:   Supervising Provider    Answer:   Nicholaus BloomVE, MYRA C [[id number]]    Assessment and Plan  --33 y.o. G2P0 at 6430w2d with pregnancy of unknown location --Quant hCG = 14 --B POS --Bacterial Vaginosis, Asymptomatic Anemia. Rx to pharmacy --Discharge home in stable condition with ectopic and bleeding precautions  F/U: --Return to MAU Friday 12/04 after 12:30pm for repeat blood work  Calvert CantorSamantha C Janille Draughon, CNM 10/18/2019, 3:13 PM

## 2019-10-18 NOTE — MAU Note (Signed)
Pt states she went to primary care doctor due to vag bleeding (cycle has been late past few months) she began 11/16 x 7 days. Subsided 11/23 & returned on 11/24; has since then increased flow.  Pt states bleeding bright red now without pain or clots, has not saturated pad in an hr.  States vaginal odor approx 11/23, no itching.  Denies UTI like symptoms. States sexual intercourse in last week.

## 2019-10-18 NOTE — Discharge Instructions (Signed)
Human Chorionic Gonadotropin Test °Why am I having this test? °A human chorionic gonadotropin (hCG) test is done to determine whether you are pregnant. It can also be used: °· To diagnose an abnormal pregnancy. °· To determine whether you have had a failed pregnancy (miscarriage) or are at risk of one. °What is being tested? °This test checks the level of the human chorionic gonadotropin (hCG) hormone in the blood. This hormone is produced during pregnancy by the cells that form the placenta. The placenta is the organ that grows inside your womb (uterus) to nourish a developing baby. When you are pregnant, hCG can be detected in your blood or urine 7 to 8 days before your missed period. It continues to go up for the first 8-10 weeks of pregnancy. °The presence of hCG in your blood can be measured with several different types of tests. You may have: °· A urine test. °? Because this hormone is eliminated from your body by your kidneys, you may have a urine test to find out whether you are pregnant. A home pregnancy test detects whether there is hCG in your urine. °? A urine test only shows whether there is hCG in your urine. It does not measure how much. °· A qualitative blood test. °? You may have this type of blood test to find out if you are pregnant. °? This blood test only shows whether there is hCG in your blood. It does not measure how much. °· A quantitative blood test. °? This type of blood test measures the amount of hCG in your blood. °? You may have this test to: °§ Diagnose an abnormal pregnancy. °§ Check whether you have had a miscarriage. °§ Determine whether you are at risk of a miscarriage. °What kind of sample is taken? ° °  ° °Two kinds of samples may be collected to test for the hCG hormone. °· Blood. It is usually collected by inserting a needle into a blood vessel. °· Urine. It is usually collected by urinating into a germ-free (sterile) specimen cup. It is best to collect the sample the first  time you urinate in the morning. °How do I prepare for this test? °No preparation is needed for a blood test.  °For the urine test: °· Let your health care provider know about: °? All medicines you are taking, including vitamins, herbs, creams, and over-the-counter medicines. °? Any blood in your urine. This may interfere with the result. °· Do not drink too much fluid. Drink as you normally would, or as directed by your health care provider. °How are the results reported? °Depending on the type of test that you have, your test results may be reported as values. Your health care provider will compare your results to normal ranges that were established after testing a large group of people (reference ranges). Reference ranges may vary among labs and hospitals. For this test, common reference ranges that show absence of pregnancy are: °· Quantitative hCG blood levels: less than 5 IU/L. °Other results will be reported as either positive or negative. For this test, normal results (meaning the absence of pregnancy) are: °· Negative for hCG in the urine test. °· Negative for hCG in the qualitative blood test. °What do the results mean? °Urine and qualitative blood test °· A negative result could mean: °? That you are not pregnant. °? That the test was done too early in your pregnancy to detect hCG in your blood or urine. If you still have other signs   of pregnancy, the test will be repeated. °· A positive result means: °? That you are most likely pregnant. Your health care provider may confirm your pregnancy with an imaging study (ultrasound) of your uterus, if needed. °Quantitative blood test °Results of the quantitative hCG blood test will be interpreted as follows: °· Less than 5 IU/L: You are most likely not pregnant. °· Greater than 25 IU/L: You are most likely pregnant. °· hCG levels that are higher than expected: °? You are pregnant with twins. °? You have abnormal growths in the uterus. °· hCG levels that are  rising more slowly than expected: °? You have an ectopic pregnancy (also called a tubal pregnancy). °· hCG levels that are falling: °? You may be having a miscarriage. °Talk with your health care provider about what your results mean. °Questions to ask your health care provider °Ask your health care provider, or the department that is doing the test: °· When will my results be ready? °· How will I get my results? °· What are my treatment options? °· What other tests do I need? °· What are my next steps? °Summary °· A human chorionic gonadotropin test is done to determine whether you are pregnant. °· When you are pregnant, hCG can be detected in your blood or urine 7 to 8 days before your missed period. It continues to go up for the first 8-10 weeks of pregnancy. °· Your hCG level can be measured with different types of tests. You may have a urine test, a qualitative blood test, or a quantitative blood test. °· Talk with your health care provider about what your results mean. °This information is not intended to replace advice given to you by your health care provider. Make sure you discuss any questions you have with your health care provider. °Document Released: 12/04/2004 Document Revised: 10/04/2017 Document Reviewed: 10/04/2017 °Elsevier Patient Education © 2020 Else[employer] Inc. ° °

## 2019-10-19 LAB — GC/CHLAMYDIA PROBE AMP (~~LOC~~) NOT AT ARMC
Chlamydia: NEGATIVE
Comment: NEGATIVE
Comment: NORMAL
Neisseria Gonorrhea: NEGATIVE

## 2019-10-20 ENCOUNTER — Telehealth: Payer: Self-pay | Admitting: Advanced Practice Midwife

## 2019-10-20 ENCOUNTER — Telehealth: Payer: Self-pay | Admitting: Certified Nurse Midwife

## 2019-10-20 ENCOUNTER — Other Ambulatory Visit: Payer: Self-pay

## 2019-10-20 ENCOUNTER — Inpatient Hospital Stay (HOSPITAL_COMMUNITY)
Admission: AD | Admit: 2019-10-20 | Discharge: 2019-10-20 | Disposition: A | Discharge status: Home / Self Care | Payer: Medicaid Other | Attending: Obstetrics & Gynecology | Admitting: Obstetrics & Gynecology

## 2019-10-20 DIAGNOSIS — O3680X0 Pregnancy with inconclusive fetal viability, not applicable or unspecified: Secondary | ICD-10-CM | POA: Diagnosis not present

## 2019-10-20 DIAGNOSIS — Z3A01 Less than 8 weeks gestation of pregnancy: Secondary | ICD-10-CM | POA: Insufficient documentation

## 2019-10-20 LAB — HCG, QUANTITATIVE, PREGNANCY: hCG, Beta Chain, Quant, S: 13 m[IU]/mL — ABNORMAL HIGH (ref <5)

## 2019-10-20 NOTE — MAU Provider Note (Signed)
Ms. Carla Patel  is a 33 y.o. G2P0 at [redacted]w[redacted]d who presents to MAU today for follow-up quant hCG after 48 hours. The patient was seen in MAU on 10/18/19 and had quant hCG of 14 and US showed no IUP or adnexal mass. She denies pain, vaginal bleeding or fever today.   OB History  Gravida Para Term Preterm AB Living  2         1  SAB TAB Ectopic Multiple Live Births          1    # Outcome Date GA Lbr Len/2nd Weight Sex Delivery Anes PTL Lv  2 Current           1 Gravida 09/24/06    M CS-LTranv  N LIV     Complications: Preeclampsia    Past Medical History:  Diagnosis Date  . Elevated BP   . Fatigue   . Pre-eclampsia    takes meds  . Sciatica   . Seasonal allergies    ROS: no VB no pain  LMP 10/02/2019 (Exact Date)   CONSTITUTIONAL: Well-developed, well-nourished female in no acute distress.  MUSCULOSKELETAL: Normal range of motion.  CARDIOVASCULAR: Regular heart rate RESPIRATORY: Normal effort NEUROLOGICAL: Alert and oriented to person, place, and time.  SKIN: Not diaphoretic. No erythema. No pallor. PSYCH: Normal mood and affect. Normal behavior. Normal judgment and thought content.  Results for orders placed or performed during the hospital encounter of 10/20/19 (from the past 24 hour(s))  hCG, quantitative, pregnancy     Status: Abnormal   Collection Time: 10/20/19  1:58 PM  Result Value Ref Range   hCG, Beta Chain, Quant, S 13 (H) <5 mIU/mL   MDM: Minimal fall in qhcg indicates abnormal pregnancy, but unclear if failed pregnancy or ectopic. Discussed findings with pt. Recommend 48 hr follow up. Pt states she will be out of town for 1 week and cannot f/u until she is back on 12/14. Strongly recommend she seek care in the city she is traveling to for f/u qhcg if possible. Discussed return precautions, when to seek emergency care.   A: 1. Pregnancy, location unknown    P: Discharge home First trimester/ectopic precautions discussed Follow up at [facility] on 12/14- message  sent Patient may return to MAU or closest ED as needed or if her condition were to change or worsen   Julianne Handler, [place] 10/20/2019 3:21 PM

## 2019-10-20 NOTE — Telephone Encounter (Signed)
Erroneous entry

## 2019-10-20 NOTE — Telephone Encounter (Signed)
Called back to see if she would need to send an order for repeat HCG when she goes out of town. Explained that we probably can't, but that I would write at Rx for HCG and she can try taking it to [facility] or go to an Urgent care. Instructed to go to ED for worsening pain, heavier vaginal bleeding.   Tamala Julian, [place], [place] 10/20/2019 5:32 PM

## 2019-10-20 NOTE — MAU Note (Signed)
Pt left before vital signs were taken

## 2019-10-23 ENCOUNTER — Telehealth: Payer: Self-pay | Admitting: Family Medicine

## 2019-10-23 NOTE — Telephone Encounter (Signed)
Attempted to contact patient with her stat appointment ( 12/14 @ 9:00) No answer, left voicemail w/ appointment information. Patient instructed to wear a face mask for the entire appointment and nov visitors are allowed. Patient instructed to give the office a call back if needing to reschedule.

## 2019-10-26 ENCOUNTER — Telehealth: Payer: Self-pay | Admitting: Advanced Practice Midwife

## 2019-10-26 NOTE — Telephone Encounter (Signed)
Called for results of Quantitative BHCG which was 7.  Results for Carla Patel, Carla Patel (MRN [id number]) as of 10/26/2019 13:35  Ref. Range 10/18/2019 12:45 10/20/2019 13:58  HCG, Beta Chain, Quant, S Latest Ref Range: <5 mIU/mL 14 (H) 13 (H)    Informed pt that this is C/W SAB. Pt is scheduled for F/U visit at [facility] on 12/14. Will schedule w/ provider.  Support given.  Tamala Julian, [place], [place] 10/26/2019 2:40 PM

## 2019-10-30 ENCOUNTER — Ambulatory Visit: Payer: Medicaid Other

## 2019-10-31 ENCOUNTER — Ambulatory Visit (INDEPENDENT_AMBULATORY_CARE_PROVIDER_SITE_OTHER): Payer: Medicaid Other | Admitting: *Deleted

## 2019-10-31 ENCOUNTER — Other Ambulatory Visit: Payer: Self-pay

## 2019-10-31 DIAGNOSIS — O3680X0 Pregnancy with inconclusive fetal viability, not applicable or unspecified: Secondary | ICD-10-CM

## 2019-10-31 LAB — BETA HCG QUANT (REF LAB): hCG Quant: 4 m[IU]/mL

## 2019-10-31 NOTE — Progress Notes (Signed)
Pt states she is not having pain or vaginal bleeding. Stat BHCG drawn and pt will be contacted with results later today. She stated that a VM could be left if she does not answer.   1720  Results of BHCG reviewed by Maye Hides, CNM. Pt was called and informed of results which are consistent with failed pregnancy. Pt has follow up appt (virtual) on 12/29 as well as virtual Behavioral Health appt on 12/28. Pt is aware of both appointments. She voiced understanding and agreed to plan of care

## 2019-11-02 NOTE — BH Specialist Note (Addendum)
Integrated Behavioral Health via Telemedicine Video Visit  This note is not being shared with the patient for the following reason: Pt says "I don't want to keep being reminded of the loss".    11/02/2019 Carla Patel [id number]  Number of Integrated Behavioral Health visits: 1 Session Start time: 1:18  Session End time: 1:33 Total time: 15  Referring Provider: Maye Hides Type of Visit: Video Patient/Family location: Home [facility] Provider location: [facility] All persons participating in visit: Patient Carla Patel and [place]    Confirmed patient's address: Yes  Confirmed patient's phone number: Yes  Any changes to demographics: No   Confirmed patient's insurance: Yes  Any changes to patient's insurance: No   Discussed confidentiality: Yes   I connected with Carla Patel by a video enabled telemedicine application and verified that I am speaking with the correct person using two identifiers.     I discussed the limitations of evaluation and management by telemedicine and the availability of in person appointments.  I discussed that the purpose of this visit is to provide behavioral health care while limiting exposure to the novel coronavirus.   Discussed there is a possibility of technology failure and discussed alternative modes of communication if that failure occurs.  I discussed that engaging in this video visit, they consent to the provision of behavioral healthcare and the services will be billed under their insurance.  Patient and/or legal guardian expressed understanding and consented to video visit: Yes   PRESENTING CONCERNS: Patient and/or family reports the following symptoms/concerns: Pt states she has no concern today, and is coping well after recent early miscarriage.  Duration of problem: About 2 weeks; Severity of problem: -  STRENGTHS (Protective Factors/Coping Skills): -  GOALS ADDRESSED: Patient will: 1.  Increase knowledge and/or ability  of: healthy habits  2.  Demonstrate ability to: Increase healthy adjustment to current life circumstances  INTERVENTIONS: Interventions utilized:  Psychoeducation and/or Health Education Standardized Assessments completed: Not Needed  ASSESSMENT: Patient currently experiencing recent Pregnancy loss   Patient may benefit from brief health education only today.  [name]  PLAN: 1. Follow up with behavioral health clinician on : As needed 2. Behavioral recommendations:  -Continue taking prenatal vitamin daily -Complete MyChart sign up with code sent to phone via text, and use MyChart help desk as needed 3. Referral(s): [facility] (In Clinic)  I discussed the assessment and treatment plan with the patient and/or parent/guardian. They were provided an opportunity to ask questions and all were answered. They agreed with the plan and demonstrated an understanding of the instructions.   They were advised to call back or seek an in-person evaluation if the symptoms worsen or if the condition fails to improve as anticipated.  [name]

## 2019-11-09 NOTE — Progress Notes (Signed)
Chart reviewed for nurse visit. Agree with plan of care.   Starr Lake, CNM 11/09/2019 5:59 PM

## 2019-11-13 ENCOUNTER — Ambulatory Visit: Payer: Medicaid Other | Admitting: Clinical

## 2019-11-13 ENCOUNTER — Other Ambulatory Visit: Payer: Self-pay

## 2019-11-13 DIAGNOSIS — Z5189 Encounter for other specified aftercare: Secondary | ICD-10-CM

## 2019-11-13 DIAGNOSIS — O039 Complete or unspecified spontaneous abortion without complication: Secondary | ICD-10-CM

## 2019-11-14 ENCOUNTER — Encounter: Payer: Self-pay | Admitting: Student

## 2019-11-14 ENCOUNTER — Ambulatory Visit (INDEPENDENT_AMBULATORY_CARE_PROVIDER_SITE_OTHER): Payer: Medicaid Other | Admitting: Student

## 2019-11-14 ENCOUNTER — Other Ambulatory Visit: Payer: Self-pay

## 2019-11-14 DIAGNOSIS — O039 Complete or unspecified spontaneous abortion without complication: Secondary | ICD-10-CM

## 2019-11-14 HISTORY — DX: Complete or unspecified spontaneous abortion without complication: O03.9

## 2019-11-14 NOTE — Progress Notes (Signed)
Pt states no pian just started Cycle,

## 2019-11-14 NOTE — Progress Notes (Signed)
I connected with  Carla Patel on 11/14/19 at  1:55 PM EST by telephone and verified that I am speaking with the correct person using two identifiers.   I discussed the limitations, risks, security and privacy concerns of performing an evaluation and management service by telephone and the availability of in person appointments. I also discussed with the patient that there may be a patient responsible charge related to this service. The patient expressed understanding and agreed to proceed.  Bethanne Ginger, CMA 11/14/2019  1:43 PM

## 2019-11-14 NOTE — Progress Notes (Signed)
Patient ID: Carla Patel, female   DOB: [birth year], 33 y.o.   MRN: [id number]    TELEHEALTH GYNECOLOGY VIRTUAL VIDEO VISIT ENCOUNTER NOTE  Provider location: Center for [facility] [place]   I connected with Carla Patel on 11/14/19 at  1:55 PM EST by MyChart Video Encounter at home and verified that I am speaking with the correct person using two identifiers.   I discussed the limitations, risks, security and privacy concerns of performing an evaluation and management service virtually and the availability of in person appointments. I also discussed with the patient that there may be a patient responsible charge related to this service. The patient expressed understanding and agreed to proceed.   History:  Carla Patel is a 33 y.o. G2P0 female being evaluated today for follow up for SAB. She denies any abnormal vaginal discharge, bleeding, pelvic pain or other concerns.  She started her menstrual cycle yesterday; she describes the bleeding as heavy. This is normal for her.      Past Medical History:  Diagnosis Date  . Elevated BP   . Fatigue   . Pre-eclampsia    takes meds  . Sciatica   . Seasonal allergies    Past Surgical History:  Procedure Laterality Date  . CESAREAN SECTION    . KNEE ARTHROSCOPY    . TONSILLECTOMY     The following portions of the patient's history were reviewed and updated as appropriate: allergies, current medications, past family history, past medical history, past social history, past surgical history and problem list.    Review of Systems:  Pertinent items noted in HPI and remainder of comprehensive ROS otherwise negative.  Physical Exam:   General:  Alert, oriented and cooperative. Patient appears to be in no acute distress.  Mental Status: Normal mood and affect. Normal behavior. Normal judgment and thought content.   Respiratory: Normal respiratory effort, no problems with respiration noted  Rest of physical exam deferred due to type of  encounter  Labs and Imaging No results found for this or any previous visit (from the past 336 hour(s)). US OB LESS THAN 14 WEEKS WITH OB TRANSVAGINAL  Result Date: 10/18/2019 CLINICAL DATA:  Vaginal bleeding in first trimester of pregnancy EXAM: OBSTETRIC <14 WK [place] AND TRANSVAGINAL OB US TECHNIQUE: Both transabdominal and transvaginal ultrasound examinations were performed for complete evaluation of the gestation as well as the maternal uterus, adnexal regions, and pelvic cul-de-sac. Transvaginal technique was performed to assess early pregnancy. COMPARISON:  None for this gestation FINDINGS: Intrauterine gestational sac: Absent Yolk sac:  N/A Embryo:  N/A Cardiac Activity: N/A Heart Rate: N/A  bpm MSD:   mm    w     d CRL:    mm    w    d                  [place] EDC: Subchorionic hemorrhage:  N/A . Maternal uterus/adnexae: Retroverted uterus with mildly heterogeneous myometrium. No uterine mass or gestational sac identified. Endometrial complex unremarkable without fluid or gestational sac. RIGHT ovary normal size and morphology, 1.3 x 2.0 x 1.3 cm. LEFT ovary normal size and morphology, 2.7 x 1.1 x 1.5 cm. Trace free pelvic fluid. No adnexal masses. IMPRESSION: No intrauterine gestation identified. Findings are compatible with pregnancy of unknown location. Differential diagnosis includes early intrauterine pregnancy too early to visualize, spontaneous abortion, and ectopic pregnancy. Serial quantitative beta hCG and or followup ultrasound recommended to definitively exclude ectopic pregnancy. Electronically  Signed   By: Lavonia Dana M.D.   On: 10/18/2019 14:25       Assessment and Plan:      1. Miscarriage   Reviewed bHCG from December, patients beta is less than 4.  -Patient wants IUD; she also needs a pap smear as we have none on file.  -Message sent to [place] pool to schedule patient for in-person gyn visit.   I discussed the assessment and treatment plan with the patient. The patient was  provided an opportunity to ask questions and all were answered. The patient agreed with the plan and demonstrated an understanding of the instructions.   The patient was advised to call back or seek an in-person evaluation/go to the ED if the symptoms worsen or if the condition fails to improve as anticipated.  I provided 11 minutes of face-to-face time during this encounter.   Starr Lake, Miner for [employer], [place]

## 2019-12-12 ENCOUNTER — Ambulatory Visit: Payer: Medicaid Other | Admitting: Student

## 2020-02-07 DIAGNOSIS — G4733 Obstructive sleep apnea (adult) (pediatric): Secondary | ICD-10-CM | POA: Insufficient documentation

## 2020-03-14 DIAGNOSIS — R7303 Prediabetes: Secondary | ICD-10-CM | POA: Insufficient documentation

## 2020-04-03 DIAGNOSIS — D500 Iron deficiency anemia secondary to blood loss (chronic): Secondary | ICD-10-CM | POA: Insufficient documentation

## 2020-04-03 DIAGNOSIS — D509 Iron deficiency anemia, unspecified: Secondary | ICD-10-CM | POA: Insufficient documentation

## 2020-04-09 ENCOUNTER — Ambulatory Visit: Payer: Medicaid Other | Admitting: Student

## 2020-05-14 ENCOUNTER — Ambulatory Visit: Payer: Medicaid Other | Admitting: Student

## 2020-06-11 ENCOUNTER — Ambulatory Visit (HOSPITAL_COMMUNITY)
Admission: EM | Admit: 2020-06-11 | Discharge: 2020-06-11 | Disposition: A | Discharge status: Home / Self Care | Payer: Medicaid Other | Attending: Urgent Care | Admitting: Urgent Care

## 2020-06-11 ENCOUNTER — Inpatient Hospital Stay (HOSPITAL_COMMUNITY)
Admission: AD | Admit: 2020-06-11 | Discharge: 2020-06-11 | Disposition: A | Discharge status: Home / Self Care | Payer: Medicaid Other | Attending: Obstetrics and Gynecology | Admitting: Obstetrics and Gynecology

## 2020-06-11 ENCOUNTER — Other Ambulatory Visit: Payer: Self-pay

## 2020-06-11 ENCOUNTER — Inpatient Hospital Stay (HOSPITAL_COMMUNITY): Payer: Medicaid Other

## 2020-06-11 ENCOUNTER — Encounter (HOSPITAL_COMMUNITY): Payer: Self-pay | Admitting: Obstetrics and Gynecology

## 2020-06-11 ENCOUNTER — Encounter (HOSPITAL_COMMUNITY): Payer: Self-pay

## 2020-06-11 DIAGNOSIS — O26891 Other specified pregnancy related conditions, first trimester: Secondary | ICD-10-CM

## 2020-06-11 DIAGNOSIS — O09521 Supervision of elderly multigravida, first trimester: Secondary | ICD-10-CM | POA: Insufficient documentation

## 2020-06-11 DIAGNOSIS — R110 Nausea: Secondary | ICD-10-CM

## 2020-06-11 DIAGNOSIS — Z87891 Personal history of nicotine dependence: Secondary | ICD-10-CM | POA: Diagnosis not present

## 2020-06-11 DIAGNOSIS — O3680X0 Pregnancy with inconclusive fetal viability, not applicable or unspecified: Secondary | ICD-10-CM

## 2020-06-11 DIAGNOSIS — Z3201 Encounter for pregnancy test, result positive: Secondary | ICD-10-CM

## 2020-06-11 DIAGNOSIS — Z3A01 Less than 8 weeks gestation of pregnancy: Secondary | ICD-10-CM | POA: Insufficient documentation

## 2020-06-11 DIAGNOSIS — R102 Pelvic and perineal pain: Secondary | ICD-10-CM

## 2020-06-11 DIAGNOSIS — R109 Unspecified abdominal pain: Secondary | ICD-10-CM | POA: Insufficient documentation

## 2020-06-11 DIAGNOSIS — Z79899 Other long term (current) drug therapy: Secondary | ICD-10-CM | POA: Diagnosis not present

## 2020-06-11 DIAGNOSIS — R1030 Lower abdominal pain, unspecified: Secondary | ICD-10-CM

## 2020-06-11 LAB — TYPE AND SCREEN
ABO/RH(D): B POS
Antibody Screen: NEGATIVE

## 2020-06-11 LAB — CBC
HCT: 42.4 % (ref 36.0–46.0)
Hemoglobin: 13.7 g/dL (ref 12.0–15.0)
MCH: 26.6 pg (ref 26.0–34.0)
MCHC: 32.3 g/dL (ref 30.0–36.0)
MCV: 82.3 fL (ref 80.0–100.0)
Platelets: 250 10*3/uL (ref 150–400)
RBC: 5.15 MIL/uL — ABNORMAL HIGH (ref 3.87–5.11)
WBC: 7.5 10*3/uL (ref 4.0–10.5)
nRBC: 0 % (ref 0.0–0.2)

## 2020-06-11 LAB — COMPREHENSIVE METABOLIC PANEL
ALT: 16 U/L (ref 0–44)
AST: 14 U/L — ABNORMAL LOW (ref 15–41)
Albumin: 3.4 g/dL — ABNORMAL LOW (ref 3.5–5.0)
Alkaline Phosphatase: 67 U/L (ref 38–126)
Anion gap: 6 (ref 5–15)
BUN: 7 mg/dL (ref 6–20)
CO2: 26 mmol/L (ref 22–32)
Calcium: 8.4 mg/dL — ABNORMAL LOW (ref 8.9–10.3)
Chloride: 102 mmol/L (ref 98–111)
Creatinine, Ser: 0.63 mg/dL (ref 0.44–1.00)
GFR calc Af Amer: >60 mL/min (ref >60)
GFR calc non Af Amer: >60 mL/min (ref >60)
Glucose, Bld: 91 mg/dL (ref 70–99)
Potassium: 3.7 mmol/L (ref 3.5–5.1)
Sodium: 134 mmol/L — ABNORMAL LOW (ref 135–145)
Total Bilirubin: 0.7 mg/dL (ref 0.3–1.2)
Total Protein: 6.5 g/dL (ref 6.5–8.1)

## 2020-06-11 LAB — POCT URINALYSIS DIP (DEVICE)
Bilirubin Urine: NEGATIVE
Glucose, UA: NEGATIVE mg/dL
Hgb urine dipstick: NEGATIVE
Ketones, ur: NEGATIVE mg/dL
Leukocytes,Ua: NEGATIVE
Nitrite: NEGATIVE
Protein, ur: 30 mg/dL — AB
Specific Gravity, Urine: 1.025 (ref 1.005–1.030)
Urobilinogen, UA: 0.2 mg/dL (ref 0.0–1.0)
pH: 7 (ref 5.0–8.0)

## 2020-06-11 LAB — POC URINE PREG, ED: Preg Test, Ur: POSITIVE — AB

## 2020-06-11 LAB — HCG, QUANTITATIVE, PREGNANCY: hCG, Beta Chain, Quant, S: 7012 m[IU]/mL — ABNORMAL HIGH (ref <5)

## 2020-06-11 MED ORDER — METHOTREXATE FOR ECTOPIC PREGNANCY
50.0000 mg/m2 | Freq: Once | INTRAMUSCULAR | Status: AC
  Administered 2020-06-11: 120 mg via INTRAMUSCULAR
  Filled 2020-06-11: qty 1

## 2020-06-11 MED ORDER — ACETAMINOPHEN 325 MG PO TABS
650.0000 mg | ORAL_TABLET | Freq: Once | ORAL | Status: AC
  Administered 2020-06-11: 650 mg via ORAL

## 2020-06-11 MED ORDER — ACETAMINOPHEN 325 MG PO TABS
ORAL_TABLET | ORAL | Status: AC
  Filled 2020-06-11: qty 2

## 2020-06-11 NOTE — MAU Provider Note (Signed)
History     [id number]  Arrival date and time: 06/11/20 0998    Chief Complaint  Patient presents with  . Abdominal Pain     HPI Carla Patel is a 34 y.o. at [redacted]w[redacted]d by LMP with PMHx notable for SAB (G2-10/2019), who presents for abdominal cramping that started approx 1 week ago. She has also felt nauseated during this time. LMP approx 1 month ago. Currently sexually active, last intercourse 1 week ago. Does not use contraception. No abnormal vaginal discharge. No fever/chills. She denies vaginal bleeding.  She presented to Urgent Care earlier this morning for these symptoms and was transferred to MAU for rule out ectopic pregnancy in the setting of positive pregnancy test and abdominal cramping.    Review of urgent care visit from earlier today as noted above  --/--/B POS (12/02 1245)  OB History    Gravida  3   Para  1   Term      Preterm      AB      Living  1     SAB      TAB      Ectopic      Multiple      Live Births  1           Past Medical History:  Diagnosis Date  . Elevated BP   . Fatigue   . Pre-eclampsia    takes meds  . Sciatica   . Seasonal allergies     Past Surgical History:  Procedure Laterality Date  . CESAREAN SECTION    . KNEE ARTHROSCOPY    . TONSILLECTOMY      No family history on file.  Social History   Socioeconomic History  . Marital status: Single    Spouse name: Not on file  . Number of children: Not on file  . Years of education: Not on file  . Highest education level: Not on file  Occupational History  . Not on file  Tobacco Use  . Smoking status: Former Smoker    Types: Cigars  . Smokeless tobacco: Never Used  Vaping Use  . Vaping Use: Never used  Substance and Sexual Activity  . Alcohol use: Not Currently    Alcohol/week: 0.0 standard drinks  . Drug use: Never  . Sexual activity: Yes    Birth control/protection: None  Other Topics Concern  . Not on file  Social History Narrative  . Not on  file   Social Determinants of Health   Financial Resource Strain:   . Difficulty of Paying Living Expenses:   Food Insecurity:   . Worried About Programme researcher, broadcasting/film/video in the Last Year:   . Barista in the Last Year:   Transportation Needs:   . Freight forwarder (Medical):   [name] Lack of Transportation (Non-Medical):   Physical Activity:   . Days of Exercise per Week:   . Minutes of Exercise per Session:   Stress:   . Feeling of Stress :   Social Connections:   . Frequency of Communication with Friends and Family:   . Frequency of Social Gatherings with Friends and Family:   . Attends Religious Services:   . Active Member of Clubs or Organizations:   . Attends Banker Meetings:   [name] Marital Status:   Intimate Partner Violence:   . Fear of Current or Ex-Partner:   . Emotionally Abused:   [name] Physically Abused:   . Sexually  Abused:     No Known Allergies  No current facility-administered medications on file prior to encounter.   Current Outpatient Medications on File Prior to Encounter  Medication Sig Dispense Refill  . amLODipine (NORVASC) 5 MG tablet Take 5 mg by mouth daily.       ROS Pertinent positives and negative per HPI, all others reviewed and negative  Physical Exam   BP (!) 140/94 (BP Location: Right Arm)   Pulse 82   Temp 98.9 F (37.2 C) (Oral)   Resp 18   Ht 5\' 7"  (1.702 m)   Wt (!) 117.4 kg   LMP 05/11/2020   SpO2 97%   BMI 40.55 kg/m   Physical Exam Vitals and nursing note reviewed. Exam conducted with a chaperone present.  Constitutional:      General: She is not in acute distress.    Appearance: Normal appearance. She is normal weight.  HENT:     Head: Normocephalic and atraumatic.     Nose: Nose normal.     Mouth/Throat:     Mouth: Mucous membranes are moist.     Pharynx: Oropharynx is clear.  Eyes:     Extraocular Movements: Extraocular movements intact.     Conjunctiva/sclera: Conjunctivae normal.   Cardiovascular:     Rate and Rhythm: Normal rate.     Pulses: Normal pulses.  Pulmonary:     Effort: Pulmonary effort is normal.  Abdominal:     General: Abdomen is flat.     Palpations: Abdomen is soft.     Tenderness: There is abdominal tenderness (diffuse). There is no guarding or rebound.  Musculoskeletal:        General: Normal range of motion.     Cervical back: Normal range of motion and neck supple.  Skin:    General: Skin is warm and dry.  Neurological:     General: No focal deficit present.     Mental Status: She is alert and oriented to person, place, and time. Mental status is at baseline.  Psychiatric:        Mood and Affect: Mood normal.        Behavior: Behavior normal.     Cervical Exam  not completed  Bedside Ultrasound Formal u/s ordered given patient's symptoms and GA  My interpretation: n/a  FHT Not completed given GA  Labs Results for orders placed or performed during the hospital encounter of 06/11/20 (from the past 24 hour(s))  POCT urinalysis dip (device)     Status: Abnormal   Collection Time: 06/11/20  8:27 AM  Result Value Ref Range   Glucose, UA NEGATIVE NEGATIVE mg/dL   Bilirubin Urine NEGATIVE NEGATIVE   Ketones, ur NEGATIVE NEGATIVE mg/dL   Specific Gravity, Urine 1.025 1.005 - 1.030   Hgb urine dipstick NEGATIVE NEGATIVE   pH 7.0 5.0 - 8.0   Protein, ur 30 (A) NEGATIVE mg/dL   Urobilinogen, UA 0.2 0.0 - 1.0 mg/dL   Nitrite NEGATIVE NEGATIVE   Leukocytes,Ua NEGATIVE NEGATIVE  POC urine pregnancy     Status: Abnormal   Collection Time: 06/11/20  8:32 AM  Result Value Ref Range   Preg Test, Ur POSITIVE (A) NEGATIVE    Imaging No results found.  MAU Course  Procedures Lab Orders  No laboratory test(s) ordered today   No orders of the defined types were placed in this encounter.  Imaging Orders  No imaging studies ordered today    MDM moderate  Assessment and Plan  34 yo  [id number] at [redacted]w[redacted]d by certain LMP presents for  evaluation of abdominal pain/cramping x1 week.  #Abdominal pain #Pregnancy of unknown location Patient with positive pregnancy test at urgent care earlier today transferred to MAU for evaluation of pregnancy of unknown location. No vaginal bleeding. Lab work demonstrated elevated bHCG (7012), otherwise lab work unremarkable. TVUS demonstrated 2.2cm thick walled cystic structure along the left ovary that could represent ectopic gestational sac or corpus luteum. Discussed findings with attending on, Dr. Earlene Plater and had risks/benefits discussion with the patient regarding medical management vs watchful waiting that could ultimately result in surgery. Discussed this could be an IUP not visible yet on ultrasound (less likely) vs ectopic pregnancy (most likely) vs multiple gestation vs molar pregnancy. Patient opted for medical management and methotrexate administered in MAU.  The risks of methotrexate were reviewed including failure requiring repeat dosing or eventual surgery. She understands that methotrexate involves frequent return visits to monitor lab values and that she remains at risk of ectopic rupture until her beta is less than assay. ?The patient opts to proceed with methotrexate.  She has no history of hepatic or renal dysfunction, has normal BUN/Cr/LFT's/platelets.  She is felt to be reliable for follow-up. Side effects of photosensitivity & GI upset were discussed.  She knows to avoid direct sunlight and abstain from alcohol, NSAIDs and sexual intercourse for two weeks. She was counseled to discontinue any MVI with folic acid. ?She understands to follow up on D4 (06/14/20) and D7 (06/17/20) for repeat BHCG and was given the instruction sheet.  ?Strict ectopic precautions were reviewed, the patient knows to call with any abdominal pain, vomiting, fainting, or any concerns with her health.   Rozann Lesches, MD OB Fellow, Faculty [facility], Center for [facility] 06/11/2020 9:50 AM

## 2020-06-11 NOTE — Discharge Instructions (Signed)
Clinic Friday 7/30 at 9am and Monday 8/2 at 9am for hcg levels as scheduled!  Ectopic Pregnancy  An ectopic pregnancy happens when a fertilized egg grows outside the womb (uterus). The fertilized egg cannot stay alive outside of the womb. This problem often happens in a fallopian tube. It is often caused by damage to the tube. If this problem is found early, you may be treated with medicine that stops the egg from growing. If your tube tears or bursts open (ruptures), you will bleed inside. Often, there is very bad pain in the lower belly. This is an emergency. You will need surgery. Get help right away. Follow these instructions at home: After being treated with medicine or surgery:  Rest and limit your activity for as long as told by your doctor.  Until your doctor says that it is safe: ? Do not lift anything that is heavier than 10 lb (4.5 kg) or the limit that your doctor tells you. ? Avoid exercise and any movement that takes a lot of effort.  To prevent problems when pooping (constipation): ? Eat a healthy diet. This includes:  Fruits.  Vegetables.  Whole grains. ? Drink 6-8 glasses of water a day. Contact a doctor if: Get help right away if:  You have sudden and very bad pain in your belly.  You have very bad pain in your shoulders or neck.  You have pain that gets worse and is not helped by medicine.  You have: ? A fever or chills. ? Vaginal bleeding. ? Redness or swelling at the site of a surgical cut (incision).  You feel sick to your stomach (nauseous) or you throw up (vomit).  You feel dizzy or weak.  You feel light-headed or you pass out (faint). Summary  An ectopic pregnancy happens when a fertilized egg grows outside the womb (uterus).  If this problem is found early, you may be treated with medicine that stops the egg from growing.  If your tube tears or bursts open (ruptures), you will need surgery. This is an emergency. Get help right away. This  information is not intended to replace advice given to you by your health care provider. Make sure you discuss any questions you have with your health care provider. Document Revised: 10/15/2017 Document Reviewed: 11/26/2016 Elsevier Patient Education  2020 [employer].

## 2020-06-11 NOTE — Discharge Instructions (Signed)
Please report to the maternal admission unit at the [facility] next to the emergency room.  Your pregnancy test is positive and given your severe abdominal pain will need to have further work-up that we cannot provide in the urgent care setting.

## 2020-06-11 NOTE — MAU Note (Signed)
Having pain in lower abd,(started last Wed) esp on left side.  Hurts to sit, feels like something is about to fall out of vagina.  preg confirmed at [facility] and sent over for eval.  No bleeding.

## 2020-06-11 NOTE — ED Triage Notes (Signed)
Pt c/o pelvic pain, "It states like something is about to fall out of my vagina" states period is 4 days late. Denies vaginal discharge

## 2020-06-11 NOTE — ED Provider Notes (Signed)
[facility]   MRN: [id number] DOB: 1986/04/18  Subjective:   Carla Patel is a 34 y.o. female presenting for 1 day history of acute onset moderate to severe lower abdominal/pelvic pain.  Patient has also felt nauseated.  States that her cycle is actually late.  About 1 week ago she had one episode of stomach upset, nausea and vomiting that lasted a day.  After she vomited she felt better.  Did not have any more symptoms again until today.  She was sexually active 1 week ago.  States that it is a partner she trusts and does not want any STI testing.  She did have a miscarriage back in December as well.  Does not use condoms for protection.  Denies fever, cough, chest pain, shortness of breath, diarrhea, bloody stools, dysuria, hematuria, vaginal discharge.  Denies history of external hemorrhoids.  No current facility-administered medications for this encounter.  Current Outpatient Medications:  .  amLODipine (NORVASC) 5 MG tablet, Take 5 mg by mouth daily., Disp: , Rfl:    No Known Allergies  Past Medical History:  Diagnosis Date  . Elevated BP   . Fatigue   . Pre-eclampsia    takes meds  . Sciatica   . Seasonal allergies      Past Surgical History:  Procedure Laterality Date  . CESAREAN SECTION    . KNEE ARTHROSCOPY    . TONSILLECTOMY      History reviewed. No pertinent family history.  Social History   Tobacco Use  . Smoking status: Former Smoker    Types: Cigars  . Smokeless tobacco: Never Used  Vaping Use  . Vaping Use: Never used  Substance Use Topics  . Alcohol use: Not Currently    Alcohol/week: 0.0 standard drinks  . Drug use: Never    ROS   Objective:   Vitals: BP (!) 129/95   Pulse 99   Temp 98 F (36.7 C)   Resp 16   LMP  (LMP Unknown)   SpO2 99%   Physical Exam Constitutional:      General: She is not in acute distress.    Appearance: Normal appearance. She is well-developed and normal weight. She is not ill-appearing,  toxic-appearing or diaphoretic.  HENT:     Head: Normocephalic and atraumatic.     Right Ear: External ear normal.     Left Ear: External ear normal.     Nose: Nose normal.     Mouth/Throat:     Mouth: Mucous membranes are moist.     Pharynx: Oropharynx is clear.  Eyes:     General: No scleral icterus.    Extraocular Movements: Extraocular movements intact.     Pupils: Pupils are equal, round, and reactive to light.  Cardiovascular:     Rate and Rhythm: Normal rate and regular rhythm.     Pulses: Normal pulses.     Heart sounds: Normal heart sounds. No murmur heard.  No friction rub. No gallop.   Pulmonary:     Effort: Pulmonary effort is normal. No respiratory distress.     Breath sounds: Normal breath sounds. No stridor. No wheezing, rhonchi or rales.  Abdominal:     General: Bowel sounds are normal. There is no distension.     Palpations: Abdomen is soft. There is no mass.     Tenderness: There is abdominal tenderness in the right lower quadrant, periumbilical area, suprapubic area and left lower quadrant. There is no right CVA tenderness, left CVA tenderness,  guarding or rebound.  Skin:    General: Skin is warm and dry.     Coloration: Skin is not pale.     Findings: No rash.  Neurological:     General: No focal deficit present.     Mental Status: She is alert and oriented to person, place, and time.  Psychiatric:        Mood and Affect: Mood normal.        Behavior: Behavior normal.        Thought Content: Thought content normal.        Judgment: Judgment normal.     Results for orders placed or performed during the hospital encounter of 06/11/20 (from the past 24 hour(s))  POCT urinalysis dip (device)     Status: Abnormal   Collection Time: 06/11/20  8:27 AM  Result Value Ref Range   Glucose, UA NEGATIVE NEGATIVE mg/dL   Bilirubin Urine NEGATIVE NEGATIVE   Ketones, ur NEGATIVE NEGATIVE mg/dL   Specific Gravity, Urine 1.025 1.005 - 1.030   Hgb urine dipstick  NEGATIVE NEGATIVE   pH 7.0 5.0 - 8.0   Protein, ur 30 (A) NEGATIVE mg/dL   Urobilinogen, UA 0.2 0.0 - 1.0 mg/dL   Nitrite NEGATIVE NEGATIVE   Leukocytes,Ua NEGATIVE NEGATIVE  POC urine pregnancy     Status: Abnormal   Collection Time: 06/11/20  8:32 AM  Result Value Ref Range   Preg Test, Ur POSITIVE (A) NEGATIVE    Assessment and Plan :   PDMP not reviewed this encounter.  1. Pelvic pain in female   2. Lower abdominal pain   3. Nausea without vomiting   4. Positive pregnancy test     Patient has severe abdominal pain, no sign of UTI, no sign of prolapse.  Given her positive pregnancy test, she will need to be evaluated further in the emergency room at the maternal admission unit to rule out ectopic pregnancy. Patient was given APAP prior to d/c.  I explained this to patient and she is agreeable to present there by personal vehicle.   Wallis Bamberg, [place] 06/11/20 [phone]

## 2020-06-12 LAB — CERVICOVAGINAL ANCILLARY ONLY
Bacterial Vaginitis (gardnerella): NEGATIVE
Candida Glabrata: NEGATIVE
Candida Vaginitis: POSITIVE — AB
Chlamydia: NEGATIVE
Comment: NEGATIVE
Comment: NEGATIVE
Comment: NEGATIVE
Comment: NEGATIVE
Comment: NEGATIVE
Comment: NORMAL
Neisseria Gonorrhea: NEGATIVE
Trichomonas: NEGATIVE

## 2020-06-13 ENCOUNTER — Telehealth (HOSPITAL_COMMUNITY): Payer: Self-pay

## 2020-06-13 MED ORDER — FLUCONAZOLE 150 MG PO TABS: ORAL_TABLET | ORAL | 0 refills | Status: DC

## 2020-06-13 NOTE — Telephone Encounter (Signed)
Candida (yeast) is positive.  Prescription for fluconazole was given at the urgent care visit.  Pt has since been dx with ectopic pregnancy and received methotrexate.  Consulted with Dr. Delton See.  Ok to give Diflucan.

## 2020-06-14 ENCOUNTER — Ambulatory Visit (INDEPENDENT_AMBULATORY_CARE_PROVIDER_SITE_OTHER): Payer: Medicaid Other | Admitting: *Deleted

## 2020-06-14 ENCOUNTER — Other Ambulatory Visit: Payer: Self-pay

## 2020-06-14 VITALS — BP 125/93 | HR 95 | Ht 67.0 in | Wt 258.0 lb

## 2020-06-14 DIAGNOSIS — O3680X0 Pregnancy with inconclusive fetal viability, not applicable or unspecified: Secondary | ICD-10-CM

## 2020-06-14 LAB — BETA HCG QUANT (REF LAB): hCG Quant: 7599 m[IU]/mL

## 2020-06-14 NOTE — Progress Notes (Signed)
Patient was assessed and managed by nursing staff during this encounter. I have reviewed the chart and agree with the documentation and plan. I have also made any necessary editorial changes.  Tillman Kazmierski, MD 06/14/2020 4:10 PM   

## 2020-06-14 NOTE — Progress Notes (Signed)
Pt presents for stat BHCG Rapheal Masso#4 following MTX. She reports no vaginal bleeding. She endorses some cramping which has not changed since MAU visit on 7/27 - pain scale 5. Pt reports sadness because this was a desired pregnacy and she has already had one miscarriage. Emotional support given. She was advised that she will be called with test results later today.   1215  Results reviewed with Dr. Vergie Living who finds slight increase in BHCG. He recommends pt to keep appt as sche//duled on 8/2 for repeat stat BHCG. Pt was called and given results information. She was advised to keep appt as scheduled on. She should go to MAU if she develops heavy vaginal bleeding or increase in abdominal pain. Pt had questions regarding whether or not this pregnacy might have developed in her uterus if she had waited longer before receiving the MTX. I advised pt that I can only report her ultrasound showed concern for a possible ectopic pregnancy which can be very serious. I cannot state what might have happened if she did not receive the MTX. She may request to speak with a provider when she returns on Monday if desired. Pt voiced understanding of all information and instructions given.

## 2020-06-17 ENCOUNTER — Ambulatory Visit (INDEPENDENT_AMBULATORY_CARE_PROVIDER_SITE_OTHER): Payer: Medicaid Other | Admitting: *Deleted

## 2020-06-17 ENCOUNTER — Other Ambulatory Visit: Payer: Self-pay

## 2020-06-17 DIAGNOSIS — O3680X0 Pregnancy with inconclusive fetal viability, not applicable or unspecified: Secondary | ICD-10-CM

## 2020-06-17 LAB — BETA HCG QUANT (REF LAB): hCG Quant: 5277 m[IU]/mL

## 2020-06-17 NOTE — Progress Notes (Signed)
Agree with A & P. 

## 2020-06-17 NOTE — Progress Notes (Signed)
4:34pm received stat bhcg result ( 5277). Reviewed with Dr. Alysia Penna.  I called Crisol and notified her of result of stat bhcg and that was appropriate drop in bhcg. Plan is weekly non stat bhcg until returns to normal level. She voices understanding. Berlynn Warsame,RN

## 2020-06-17 NOTE — Progress Notes (Signed)
Here for stat bhcg day 7 after methotrexate.  Denies bleeding . States still having cramping ; but less than before =3.  I explained we will draw stat bhcg; then she will check out. I eplained I will call her with results in about 2 hours after I have received results; reviewed with provider and received plan of care. She voices understanding. Skyah Hannon,RN

## 2020-06-24 ENCOUNTER — Other Ambulatory Visit: Payer: Self-pay | Admitting: *Deleted

## 2020-06-24 DIAGNOSIS — O039 Complete or unspecified spontaneous abortion without complication: Secondary | ICD-10-CM

## 2020-06-25 ENCOUNTER — Other Ambulatory Visit: Payer: Medicaid Other

## 2020-10-08 ENCOUNTER — Encounter (HOSPITAL_COMMUNITY): Payer: Self-pay | Admitting: Emergency Medicine

## 2020-10-08 ENCOUNTER — Ambulatory Visit (HOSPITAL_COMMUNITY)
Admission: EM | Admit: 2020-10-08 | Discharge: 2020-10-08 | Disposition: A | Discharge status: Home / Self Care | Payer: Medicaid Other | Attending: Emergency Medicine | Admitting: Emergency Medicine

## 2020-10-08 ENCOUNTER — Other Ambulatory Visit: Payer: Self-pay

## 2020-10-08 DIAGNOSIS — N898 Other specified noninflammatory disorders of vagina: Secondary | ICD-10-CM | POA: Diagnosis not present

## 2020-10-08 MED ORDER — FLUCONAZOLE 150 MG PO TABS: 150.0000 mg | ORAL_TABLET | Freq: Every day | ORAL | 0 refills | Status: DC

## 2020-10-08 MED ORDER — NYSTATIN 100000 UNIT/GM EX CREA: TOPICAL_CREAM | CUTANEOUS | 0 refills | Status: DC

## 2020-10-08 NOTE — Discharge Instructions (Signed)
Take the Diflucan as directed.  Use the nystatin cream as directed.  Your vaginal tests are pending.  If your test results are positive, we will call you.  You and your sexual partner(s) may require treatment at that time.  Do not have sexual activity until the test results are back.    Follow-up with your primary care provider or gynecologist if your symptoms are not improving.

## 2020-10-08 NOTE — ED Provider Notes (Signed)
[facility]    CSN: [id number] Arrival date & time: 10/08/20  2947      History   Chief Complaint Chief Complaint  Patient presents with  . Vaginitis    HPI Carla Patel is a 34 y.o. female.   Patient presents with vaginal itching and irritation x1 day.  She also reports small amount of thick white vaginal discharge.  She states she was recently on an antibiotic and believes she has a yeast infection.  She denies fever, chills, abdominal pain, dysuria, back pain, pelvic pain, rash, lesions, or other symptoms.  Her medical history includes elevated blood pressure, seasonal allergies, sciatica, fatigue.  The history is provided by the patient.    Past Medical History:  Diagnosis Date  . Elevated BP   . Fatigue   . Pre-eclampsia    takes meds  . Sciatica   . Seasonal allergies     Patient Active Problem List   Diagnosis Date Noted  . Miscarriage 11/14/2019    Past Surgical History:  Procedure Laterality Date  . CESAREAN SECTION    . KNEE ARTHROSCOPY    . LAPAROSCOPY     Uterine  . TONSILLECTOMY      OB History    Gravida  3   Para  1   Term      Preterm      AB      Living  1     SAB      TAB      Ectopic      Multiple      Live Births  1            Home Medications    Prior to Admission medications   Medication Sig Start Date End Date Taking? Authorizing Provider  amLODipine (NORVASC) 5 MG tablet Take 5 mg by mouth daily. Patient not taking: Reported on 06/17/2020    [provider]  fluconazole (DIFLUCAN) 150 MG tablet Take 1 tablet (150 mg total) by mouth daily. Take one tablet today.  May repeat in 3 days. 10/08/20   Mickie Bail, NP  nystatin cream (MYCOSTATIN) Apply to affected area 2 times daily 10/08/20   Mickie Bail, NP    Family History Family History  Problem Relation Age of Onset  . Healthy Mother   . Healthy Father     Social History Social History   Tobacco Use  . Smoking status: Former  Smoker    Types: Cigars  . Smokeless tobacco: Never Used  Vaping Use  . Vaping Use: Never used  Substance Use Topics  . Alcohol use: Not Currently    Alcohol/week: 0.0 standard drinks  . Drug use: Never     Allergies   Patient has no known allergies.   Review of Systems Review of Systems  Constitutional: Negative for chills and fever.  HENT: Negative for ear pain and sore throat.   Eyes: Negative for pain and visual disturbance.  Respiratory: Negative for cough and shortness of breath.   Cardiovascular: Negative for chest pain and palpitations.  Gastrointestinal: Negative for abdominal pain and vomiting.  Genitourinary: Positive for vaginal discharge. Negative for dysuria, flank pain, hematuria and pelvic pain.  Musculoskeletal: Negative for arthralgias and back pain.  Skin: Negative for color change and rash.  Neurological: Negative for seizures and syncope.  All other systems reviewed and are negative.    Physical Exam Triage Vital Signs ED Triage Vitals  Enc Vitals Group  BP      Pulse      Resp      Temp      Temp src      SpO2      Weight      Height      Head Circumference      Peak Flow      Pain Score      Pain Loc      Pain Edu?      Excl. in GC?    No data found.  Updated Vital Signs BP (!) 140/96 (BP Location: Right Arm)   Pulse 80   Temp 97.9 F (36.6 C) (Temporal)   Resp 18   LMP 10/02/2020   SpO2 99%   Breastfeeding Unknown   Visual Acuity Right Eye Distance:   Left Eye Distance:   Bilateral Distance:    Right Eye Near:   Left Eye Near:    Bilateral Near:     Physical Exam Vitals and nursing note reviewed.  Constitutional:      General: She is not in acute distress.    Appearance: She is well-developed. She is not ill-appearing.  HENT:     Head: Normocephalic and atraumatic.     Mouth/Throat:     Mouth: Mucous membranes are moist.     Pharynx: Oropharynx is clear.  Eyes:     Conjunctiva/sclera: Conjunctivae normal.    Cardiovascular:     Rate and Rhythm: Normal rate and regular rhythm.     Heart sounds: Normal heart sounds.  Pulmonary:     Effort: Pulmonary effort is normal. No respiratory distress.     Breath sounds: Normal breath sounds.  Abdominal:     Palpations: Abdomen is soft.     Tenderness: There is no abdominal tenderness. There is no right CVA tenderness, left CVA tenderness, guarding or rebound.  Musculoskeletal:     Cervical back: Neck supple.  Skin:    General: Skin is warm and dry.     Findings: No rash.  Neurological:     General: No focal deficit present.     Mental Status: She is alert and oriented to person, place, and time.     Gait: Gait normal.  Psychiatric:        Mood and Affect: Mood normal.        Behavior: Behavior normal.      UC Treatments / Results  Labs (all labs ordered are listed, but only abnormal results are displayed) Labs Reviewed  CERVICOVAGINAL ANCILLARY ONLY    EKG   Radiology No results found.  Procedures Procedures (including critical care time)  Medications Ordered in UC Medications - No data to display  Initial Impression / Assessment and Plan / UC Course  I have reviewed the triage vital signs and the nursing notes.  Pertinent labs & imaging results that were available during my care of the patient were reviewed by me and considered in my medical decision making (see chart for details).   Vaginal discharge and itching. Treating with Diflucan and nystatin cream. Patient obtained vaginal self swab for STD testing. Instructed patient to abstain from sexual activity until her test results are back. Discussed that she and her sexual partner may require treatment at that time. Instructed her to follow-up with her PCP or OB/GYN if her symptoms are not improving. Patient agrees to plan of care.   Final Clinical Impressions(s) / UC Diagnoses   Final diagnoses:  Vaginal discharge  Vaginal itching  Discharge Instructions     Take  the Diflucan as directed.  Use the nystatin cream as directed.  Your vaginal tests are pending.  If your test results are positive, we will call you.  You and your sexual partner(s) may require treatment at that time.  Do not have sexual activity until the test results are back.    Follow-up with your primary care provider or gynecologist if your symptoms are not improving.         ED Prescriptions    Medication Sig Dispense Auth. Provider   fluconazole (DIFLUCAN) 150 MG tablet Take 1 tablet (150 mg total) by mouth daily. Take one tablet today.  May repeat in 3 days. 2 tablet Mickie Bail, NP   nystatin cream (MYCOSTATIN) Apply to affected area 2 times daily 30 g Mickie Bail, NP     PDMP not reviewed this encounter.   Mickie Bail, NP 10/08/20 1032

## 2020-10-08 NOTE — ED Triage Notes (Signed)
Pt c/o possible yeast infection onset yesterday. Pt states she has been taking amoxicillin since Thursday. Pt states that she is having white creamy discharge and itching and burning.

## 2020-10-09 LAB — CERVICOVAGINAL ANCILLARY ONLY
Bacterial Vaginitis (gardnerella): NEGATIVE
Candida Glabrata: NEGATIVE
Candida Vaginitis: POSITIVE — AB
Chlamydia: NEGATIVE
Comment: NEGATIVE
Comment: NEGATIVE
Comment: NEGATIVE
Comment: NEGATIVE
Comment: NEGATIVE
Comment: NORMAL
Neisseria Gonorrhea: NEGATIVE
Trichomonas: NEGATIVE

## 2021-07-02 ENCOUNTER — Inpatient Hospital Stay (HOSPITAL_COMMUNITY)
Admission: AD | Admit: 2021-07-02 | Discharge: 2021-07-02 | Disposition: A | Discharge status: Home / Self Care | Payer: Medicaid Other | Attending: Obstetrics and Gynecology | Admitting: Obstetrics and Gynecology

## 2021-07-02 ENCOUNTER — Encounter (HOSPITAL_COMMUNITY): Payer: Self-pay | Admitting: Obstetrics and Gynecology

## 2021-07-02 ENCOUNTER — Inpatient Hospital Stay (HOSPITAL_COMMUNITY): Payer: Medicaid Other

## 2021-07-02 DIAGNOSIS — Z79899 Other long term (current) drug therapy: Secondary | ICD-10-CM | POA: Diagnosis not present

## 2021-07-02 DIAGNOSIS — O00102 Left tubal pregnancy without intrauterine pregnancy: Secondary | ICD-10-CM | POA: Diagnosis not present

## 2021-07-02 DIAGNOSIS — O209 Hemorrhage in early pregnancy, unspecified: Secondary | ICD-10-CM | POA: Diagnosis present

## 2021-07-02 DIAGNOSIS — Z3A08 8 weeks gestation of pregnancy: Secondary | ICD-10-CM | POA: Diagnosis not present

## 2021-07-02 DIAGNOSIS — O4691 Antepartum hemorrhage, unspecified, first trimester: Secondary | ICD-10-CM | POA: Diagnosis not present

## 2021-07-02 DIAGNOSIS — O09291 Supervision of pregnancy with other poor reproductive or obstetric history, first trimester: Secondary | ICD-10-CM | POA: Diagnosis not present

## 2021-07-02 LAB — URINALYSIS, MICROSCOPIC (REFLEX)

## 2021-07-02 LAB — CBC
HCT: 32.6 % — ABNORMAL LOW (ref 36.0–46.0)
Hemoglobin: 10 g/dL — ABNORMAL LOW (ref 12.0–15.0)
MCH: 22.6 pg — ABNORMAL LOW (ref 26.0–34.0)
MCHC: 30.7 g/dL (ref 30.0–36.0)
MCV: 73.8 fL — ABNORMAL LOW (ref 80.0–100.0)
Platelets: 323 10*3/uL (ref 150–400)
RBC: 4.42 MIL/uL (ref 3.87–5.11)
RDW: 17.7 % — ABNORMAL HIGH (ref 11.5–15.5)
WBC: 8.4 10*3/uL (ref 4.0–10.5)
nRBC: 0 % (ref 0.0–0.2)

## 2021-07-02 LAB — COMPREHENSIVE METABOLIC PANEL
ALT: 21 U/L (ref 0–44)
AST: 20 U/L (ref 15–41)
Albumin: 3.2 g/dL — ABNORMAL LOW (ref 3.5–5.0)
Alkaline Phosphatase: 61 U/L (ref 38–126)
Anion gap: 10 (ref 5–15)
BUN: 11 mg/dL (ref 6–20)
CO2: 27 mmol/L (ref 22–32)
Calcium: 9.1 mg/dL (ref 8.9–10.3)
Chloride: 100 mmol/L (ref 98–111)
Creatinine, Ser: 0.79 mg/dL (ref 0.44–1.00)
GFR, Estimated: >60 mL/min (ref >60)
Glucose, Bld: 93 mg/dL (ref 70–99)
Potassium: 3.5 mmol/L (ref 3.5–5.1)
Sodium: 137 mmol/L (ref 135–145)
Total Bilirubin: 0.3 mg/dL (ref 0.3–1.2)
Total Protein: 6.4 g/dL — ABNORMAL LOW (ref 6.5–8.1)

## 2021-07-02 LAB — URINALYSIS, ROUTINE W REFLEX MICROSCOPIC
Bilirubin Urine: NEGATIVE
Glucose, UA: NEGATIVE mg/dL
Ketones, ur: NEGATIVE mg/dL
Leukocytes,Ua: NEGATIVE
Nitrite: NEGATIVE
Protein, ur: 100 mg/dL — AB
Specific Gravity, Urine: 1.02 (ref 1.005–1.030)
pH: 7 (ref 5.0–8.0)

## 2021-07-02 LAB — HCG, QUANTITATIVE, PREGNANCY: hCG, Beta Chain, Quant, S: 2085 m[IU]/mL — ABNORMAL HIGH (ref <5)

## 2021-07-02 LAB — POCT PREGNANCY, URINE: Preg Test, Ur: POSITIVE — AB

## 2021-07-02 MED ORDER — METHOTREXATE FOR ECTOPIC PREGNANCY
50.0000 mg/m2 | Freq: Once | INTRAMUSCULAR | Status: AC
  Administered 2021-07-02: 119.5 mg via INTRAMUSCULAR
  Filled 2021-07-02: qty 4.78
  Filled 2021-07-02: qty 4.8

## 2021-07-02 NOTE — Discharge Instructions (Addendum)
You came to the hospital because you had vaginal bleeding and some cramping. We did some labs and also an ultrasound and unfortunately the ultrasound showed that you have a tubal pregnancy. Based on your hormone amount and the size of the tubal (ectopic) pregnancy we treated you with the medication Methotrexate to treat the pregnancy in your tube.   You will need the following follow-up: Come back to the MAU on sat 8/20 to get your pregnancy hormone (hcg) so we can make sure it is going down You have an appointment on Tuesday 8/23 to get another hormone level to again make sure it is still going down.  If you have really bad cramping or abdominal pain, bleeding, dizziness, lightheadedness, fevers, or chills please come back to the hospital so we can evaluate you.

## 2021-07-02 NOTE — MAU Provider Note (Signed)
History     CSN: [id number]  Arrival date and time: 07/02/21 1201   Event Date/Time   First Provider Initiated Contact with Patient 07/02/21 1633      Chief Complaint  Patient presents with   Vaginal Bleeding   HPI 35 yo F, G4P1 at [redacted]w[redacted]d by LMP who presents with 5 days of vaginal bleeding. Needing 4-5 tampons a day and seeing some small blood clots. Denies fevers, chills. Has history of 2 prior ectopic pregnancies. Was referred to [facility] for further workup after last ectopic however she did not Denies history of pelvic infections. Does report heavy periods and that she had surgery of her uterus for those periods but does  not recall what it was called (and when asked it it was an ablation she states it was not an ablation).  Currently reports some minor cramping Otherwise denies systemic symptoms.   OB History     Gravida  4   Para  1   Term      Preterm      AB      Living  1      SAB      IAB      Ectopic      Multiple      Live Births  1           Past Medical History:  Diagnosis Date   Elevated BP    Fatigue    Pre-eclampsia    takes meds   Sciatica    Seasonal allergies     Past Surgical History:  Procedure Laterality Date   CESAREAN SECTION     KNEE ARTHROSCOPY     LAPAROSCOPY     Uterine   TONSILLECTOMY      Family History  Problem Relation Age of Onset   Healthy Mother    Healthy Father     Social History   Tobacco Use   Smoking status: Former    Types: Cigars   Smokeless tobacco: Never  Vaping Use   Vaping Use: Never used  Substance Use Topics   Alcohol use: Not Currently    Alcohol/week: 0.0 standard drinks   Drug use: Never    Allergies: No Known Allergies  Medications Prior to Admission  Medication Sig Dispense Refill Last Dose   amLODipine (NORVASC) 5 MG tablet Take 5 mg by mouth daily. (Patient not taking: Reported on 06/17/2020)      fluconazole (DIFLUCAN) 150 MG tablet Take 1 tablet (150 mg total) by mouth daily.  Take one tablet today.  May repeat in 3 days. 2 tablet 0    nystatin cream (MYCOSTATIN) Apply to affected area 2 times daily 30 g 0     Review of Systems  Constitutional:  Negative for chills and fever.  Respiratory:  Negative for chest tightness.   Cardiovascular:  Negative for chest pain.  Gastrointestinal:  Positive for abdominal pain. Negative for nausea and vomiting.  Endocrine: Negative for polyuria.  Genitourinary:  Positive for vaginal bleeding. Negative for dysuria and vaginal discharge.  Musculoskeletal:  Negative for back pain.  Skin:  Negative for rash.  Physical Exam   Blood pressure 133/89, pulse 85, temperature 98.2 F (36.8 C), resp. rate 18, height 5\' 8"  (1.727 m), weight 119.3 kg, last menstrual period 05/03/2021, unknown if currently breastfeeding.  Physical Exam Constitutional:      Appearance: Normal appearance.  Cardiovascular:     Rate and Rhythm: Normal rate and regular rhythm.  Pulses: Normal pulses.  Pulmonary:     Effort: Pulmonary effort is normal.  Abdominal:     General: There is no distension.     Tenderness: There is no abdominal tenderness. There is no guarding.  Musculoskeletal:        General: Normal range of motion.  Skin:    General: Skin is warm.     Capillary Refill: Capillary refill takes less than 2 seconds.  Neurological:     General: No focal deficit present.     Mental Status: She is alert and oriented to person, place, and time.   US OB less than 14 weeks CLINICAL DATA:  Vaginal bleeding. 1st trimester pregnancy of unknown anatomic location.   EXAM: OBSTETRIC <14 WK [place] AND TRANSVAGINAL OB US   TECHNIQUE: Both transabdominal and transvaginal ultrasound examinations were performed for complete evaluation of the gestation as well as the maternal uterus, adnexal regions, and pelvic cul-de-sac. Transvaginal technique was performed to assess early pregnancy.   COMPARISON:  06/11/2020   FINDINGS: Intrauterine gestational  sac: None   Maternal uterus/adnexae: Retroflexed uterus. Endometrial thickness measures 4 mm. Normal appearance of both ovaries. A small lesion is seen in the left adnexa which shows peripheral soft tissue density in central cystic area. This measures 3.4 x 1.4 x 1.8 cm and appears separate from the left ovary. This is similar in appearance in size to previous study. No abnormal free fluid identified.   IMPRESSION: No IUP visualized. 3.4 cm left adnexal lesion which appears separate from the ovary, and is highly suspicious for ectopic pregnancy.   No evidence of hemoperitoneum.  MAU Course  Procedures  MDM 35 yo F G4P1 with hx of two prior ectopic pregnancies who presents with 1 week of vaginal bleeding (using 4-5 tampons a day) and abdominal cramping. Vital signs stable. No systemic symptoms. Differentials include ectopic pregnancy vs. Spontaneous abortion of IUP.  - [place]  - CBC  - HCG - ABO/RH  Update: [place] with 3.4 cm ectopic of left tube. HCG 2085 Assessment and Plan  Ectopic pregnancy Patient with confirmed ectopic pregnancy on Ultrasound with 3.4 cm left adnexal lesion concerning for ectopic and quant of 2085. Patient hemodynamically stable and currently and well appearing. Offered Methotrexate treatment and patient amenable.  Due to floor acuity discussed case with Dr. Shawnie Pons who agreed with the plan. - CMP normal creatinine - Methotrexate given - Patient informed to return to MAU on Sat 8/20(since weekend will do at MAU instead of office) for day 4 HCG follow up - Scheduled for 7 day f/up at [facility] office and information given to patient  2. [redacted] weeks gestation Patient with ectopic pregnancy.    Warner Mccreedy 07/02/2021, 4:34 PM

## 2021-07-02 NOTE — MAU Note (Signed)
Pt reports she has been having vaginal bleeding on and off for a few weeks. Not having much cramping .

## 2021-07-03 LAB — GC/CHLAMYDIA PROBE AMP (~~LOC~~) NOT AT ARMC
Chlamydia: NEGATIVE
Comment: NEGATIVE
Comment: NORMAL
Neisseria Gonorrhea: NEGATIVE

## 2021-07-05 ENCOUNTER — Other Ambulatory Visit: Payer: Self-pay

## 2021-07-05 ENCOUNTER — Inpatient Hospital Stay (HOSPITAL_COMMUNITY)
Admission: AD | Admit: 2021-07-05 | Discharge: 2021-07-05 | Disposition: A | Discharge status: Home / Self Care | Payer: Medicaid Other | Attending: Obstetrics and Gynecology | Admitting: Obstetrics and Gynecology

## 2021-07-05 DIAGNOSIS — O00102 Left tubal pregnancy without intrauterine pregnancy: Secondary | ICD-10-CM | POA: Diagnosis not present

## 2021-07-05 DIAGNOSIS — Z3A09 9 weeks gestation of pregnancy: Secondary | ICD-10-CM | POA: Diagnosis not present

## 2021-07-05 DIAGNOSIS — Z09 Encounter for follow-up examination after completed treatment for conditions other than malignant neoplasm: Secondary | ICD-10-CM

## 2021-07-05 LAB — HCG, QUANTITATIVE, PREGNANCY: hCG, Beta Chain, Quant, S: 1710 m[IU]/mL — ABNORMAL HIGH (ref <5)

## 2021-07-05 NOTE — MAU Note (Signed)
Carla Patel is a 35 y.o. at [redacted]w[redacted]d here in MAU reporting: here for day 4 labs post MTX. Denies any pain or bleeding.  Onset of complaint: ongoing  Pain score: 0/10  Vitals:   07/05/21 1535  BP: 127/76  Pulse: 93  Resp: 16  Temp: 98.9 F (37.2 C)  SpO2: 98%     Lab orders placed from triage: none

## 2021-07-05 NOTE — MAU Provider Note (Signed)
History   Chief Complaint:  Follow-up   Carla Patel is  35 y.o. G4P1 Patient's last menstrual period was 05/03/2021.[name] Patient is here for follow up of quantitative HCG and ongoing surveillance of pregnancy status. She is [redacted]w[redacted]d weeks gestation  by LMP.    Since her last visit, the patient is without new complaint. The patient reports bleeding as  none now.  She denies any pain.  General ROS:  negative  Her previous Quantitative HCG values are:  Results for Carla, Patel (MRN [id number]) as of 07/05/2021 16:57  Ref. Range 07/02/2021 13:39  HCG, Beta Chain, Quant, S Latest Ref Range: <5 mIU/mL 2,085 (H)     Physical Exam   Blood pressure 127/76, pulse 93, temperature 98.9 F (37.2 C), temperature source Oral, resp. rate 16, last menstrual period 05/03/2021, SpO2 98 %, unknown if currently breastfeeding.  Physical Exam Vitals and nursing note reviewed.  Constitutional:      General: She is not in acute distress.    Appearance: She is well-developed.  HENT:     Head: Normocephalic.  Eyes:     Pupils: Pupils are equal, round, and reactive to light.  Cardiovascular:     Rate and Rhythm: Normal rate.  Pulmonary:     Effort: Pulmonary effort is normal. No respiratory distress.  Abdominal:     Tenderness: There is no abdominal tenderness.  Musculoskeletal:        General: Normal range of motion.     Cervical back: Normal range of motion.  Skin:    General: Skin is warm and dry.  Neurological:     Mental Status: She is alert and oriented to person, place, and time.  Psychiatric:        Behavior: Behavior normal.        Thought Content: Thought content normal.        Judgment: Judgment normal.     Labs: Results for orders placed or performed during the hospital encounter of 07/05/21 (from the past 24 hour(s))  hCG, quantitative, pregnancy   Collection Time: 07/05/21  3:40 PM  Result Value Ref Range   hCG, Beta Chain, Quant, S 1,710 (H) <5 mIU/mL    Assessment:   1.  Left tubal pregnancy without intrauterine pregnancy   2. Follow up     Plan: -Discharge home in stable condition -Strict ectopic precautions discussed -Patient advised to follow-up with [facility] on Tuesday as scheduled for repeat HCG -Patient may return to MAU as needed or if her condition were to change or worsen  Rolm Bookbinder, CNM 07/05/2021, 4:57 PM

## 2021-07-08 ENCOUNTER — Other Ambulatory Visit (INDEPENDENT_AMBULATORY_CARE_PROVIDER_SITE_OTHER): Payer: Medicaid Other

## 2021-07-08 ENCOUNTER — Other Ambulatory Visit: Payer: Self-pay

## 2021-07-08 VITALS — BP 136/90 | HR 87 | Wt 263.8 lb

## 2021-07-08 DIAGNOSIS — O00102 Left tubal pregnancy without intrauterine pregnancy: Secondary | ICD-10-CM

## 2021-07-08 LAB — BETA HCG QUANT (REF LAB): hCG Quant: 852 m[IU]/mL

## 2021-07-08 NOTE — Progress Notes (Signed)
Beta HCG Follow-up Visit  CHENEE MUNNS presents to [facility] for follow-up beta HCG lab. She was given methotrexate for left tubal pregnancy on 07/02/21; here today for DAY 7 lab. Patient denies any bleeding or pain today; last bleeding occurrence 07/02/21. Discussed with patient that we are following beta HCG levels today. Results will be back in approximately 2 hours. Valid contact number for patient confirmed. I will call the patient with results.   Beta HCG results: 07/02/21 DAY 1 2085  07/05/21 DAY 4 1710  07/08/21 DAY 7 852   Results and patient history reviewed with Crissie Reese, MD, who states this is an appropriate decrease and pt should return in 1 week for non stat beta HCG lab draw. Patient called and informed of plan for follow-up. Lab visit scheduled for 07/15/21 at 11AM. Pt states she would like to see a provider. Explained to pt that this will be a few weeks away, but I will have front office staff schedule this appt.  Marjo Bicker 07/08/2021 10:25 AM

## 2021-07-08 NOTE — Progress Notes (Signed)
Chart reviewed for nurse visit. Agree with plan of care.   Winthrop Shannahan M, MD 07/08/21 5:16 PM 

## 2021-07-11 ENCOUNTER — Inpatient Hospital Stay (HOSPITAL_COMMUNITY)
Admission: AD | Admit: 2021-07-11 | Discharge: 2021-07-11 | Disposition: A | Discharge status: Home / Self Care | Payer: Medicaid Other | Attending: Obstetrics & Gynecology | Admitting: Obstetrics & Gynecology

## 2021-07-11 ENCOUNTER — Encounter: Payer: Self-pay | Admitting: Family Medicine

## 2021-07-11 ENCOUNTER — Encounter (HOSPITAL_COMMUNITY): Payer: Self-pay | Admitting: Obstetrics & Gynecology

## 2021-07-11 ENCOUNTER — Ambulatory Visit (INDEPENDENT_AMBULATORY_CARE_PROVIDER_SITE_OTHER): Payer: Medicaid Other | Admitting: Family Medicine

## 2021-07-11 ENCOUNTER — Other Ambulatory Visit: Payer: Self-pay

## 2021-07-11 ENCOUNTER — Inpatient Hospital Stay (HOSPITAL_COMMUNITY): Payer: Medicaid Other

## 2021-07-11 VITALS — BP 146/91 | HR 101 | Ht 68.0 in | Wt 262.2 lb

## 2021-07-11 DIAGNOSIS — O009 Unspecified ectopic pregnancy without intrauterine pregnancy: Secondary | ICD-10-CM | POA: Insufficient documentation

## 2021-07-11 DIAGNOSIS — R300 Dysuria: Secondary | ICD-10-CM

## 2021-07-11 DIAGNOSIS — O00102 Left tubal pregnancy without intrauterine pregnancy: Secondary | ICD-10-CM

## 2021-07-11 DIAGNOSIS — Z8759 Personal history of other complications of pregnancy, childbirth and the puerperium: Secondary | ICD-10-CM | POA: Insufficient documentation

## 2021-07-11 DIAGNOSIS — R102 Pelvic and perineal pain: Secondary | ICD-10-CM

## 2021-07-11 DIAGNOSIS — Z79899 Other long term (current) drug therapy: Secondary | ICD-10-CM | POA: Diagnosis not present

## 2021-07-11 DIAGNOSIS — Z3A09 9 weeks gestation of pregnancy: Secondary | ICD-10-CM | POA: Diagnosis not present

## 2021-07-11 DIAGNOSIS — O00202 Left ovarian pregnancy without intrauterine pregnancy: Secondary | ICD-10-CM

## 2021-07-11 DIAGNOSIS — R1032 Left lower quadrant pain: Secondary | ICD-10-CM | POA: Diagnosis present

## 2021-07-11 LAB — CBC
HCT: 35.2 % — ABNORMAL LOW (ref 36.0–46.0)
Hemoglobin: 10.6 g/dL — ABNORMAL LOW (ref 12.0–15.0)
MCH: 22.6 pg — ABNORMAL LOW (ref 26.0–34.0)
MCHC: 30.1 g/dL (ref 30.0–36.0)
MCV: 75.2 fL — ABNORMAL LOW (ref 80.0–100.0)
Platelets: 350 10*3/uL (ref 150–400)
RBC: 4.68 MIL/uL (ref 3.87–5.11)
RDW: 18.5 % — ABNORMAL HIGH (ref 11.5–15.5)
WBC: 7.7 10*3/uL (ref 4.0–10.5)
nRBC: 0 % (ref 0.0–0.2)

## 2021-07-11 LAB — TYPE AND SCREEN
ABO/RH(D): B POS
Antibody Screen: NEGATIVE

## 2021-07-11 NOTE — MAU Provider Note (Addendum)
Patient Carla Patel is a 35 y.o. [phone] at [redacted]w[redacted]d here with complaints of abdominal pain on the left side that started on Tuesday. It is a 7/10. She reports that she is having "a little bit" of vaginal bleeding; it is light pink and streaking. She denies nausea, vomiting, fever, SOB. She has a history of 2 ectopic pregnancies. She was diagnosed with ectopic pregnancy on 07/04/2021; was given methotrexate and had appropriate drop in quant. Quant trend as follows:   Day #0: 2085 Day #4: 1710 Day #7: 852   History     CSN: [id number]  Arrival date and time: 07/11/21 1108   Event Date/Time   First Provider Initiated Contact with Patient 07/11/21 1152      Chief Complaint  Patient presents with   Ultrasound   Abdominal Pain This is a new problem. The current episode started in the past 7 days. The problem occurs constantly. The problem has been unchanged. The pain is located in the LLQ. The pain is at a severity of 7/10. The quality of the pain is aching. The abdominal pain does not radiate. Pertinent negatives include no constipation, diarrhea, dysuria, fever, nausea or vomiting.   OB History     Gravida  4   Para  1   Term  1   Preterm      AB  3   Living  1      SAB  1   IAB      Ectopic  2   Multiple  0   Live Births  1           Past Medical History:  Diagnosis Date   Elevated BP    Fatigue    Miscarriage 11/14/2019   Pre-eclampsia    takes meds   Sciatica    Seasonal allergies     Past Surgical History:  Procedure Laterality Date   CESAREAN SECTION     KNEE ARTHROSCOPY     LAPAROSCOPY     Uterine   TONSILLECTOMY      Family History  Problem Relation Age of Onset   Healthy Mother    Healthy Father     Social History   Tobacco Use   Smoking status: Former    Types: Cigars   Smokeless tobacco: Never  Vaping Use   Vaping Use: Never used  Substance Use Topics   Alcohol use: Not Currently    Alcohol/week: 0.0 standard drinks   Drug  use: Never    Allergies: No Known Allergies  Medications Prior to Admission  Medication Sig Dispense Refill Last Dose   amLODipine (NORVASC) 5 MG tablet Take 5 mg by mouth daily.   07/10/2021   fluconazole (DIFLUCAN) 150 MG tablet Take 1 tablet (150 mg total) by mouth daily. Take one tablet today.  May repeat in 3 days. (Patient not taking: Reported on 07/11/2021) 2 tablet 0    nystatin cream (MYCOSTATIN) Apply to affected area 2 times daily (Patient not taking: Reported on 07/11/2021) 30 g 0     Review of Systems  Constitutional: Negative.  Negative for fever.  HENT: Negative.    Gastrointestinal:  Positive for abdominal pain. Negative for constipation, diarrhea, nausea and vomiting.  Genitourinary:  Positive for vaginal bleeding. Negative for dysuria.  Musculoskeletal: Negative.   Neurological: Negative.   Hematological: Negative.   Psychiatric/Behavioral: Negative.    Physical Exam   Blood pressure (!) 139/99, pulse 89, temperature 98.2 F (36.8 C), temperature source Oral, resp.  rate 18, height 5\' 8"  (1.727 m), weight 118.5 kg, last menstrual period 07/08/2021, SpO2 100 %.  Physical Exam Constitutional:      Appearance: Normal appearance.  Cardiovascular:     Rate and Rhythm: Normal rate.     Pulses: Normal pulses.  Pulmonary:     Effort: Pulmonary effort is normal.  Abdominal:     Tenderness: There is abdominal tenderness.     Comments: Slight tenderness over left ovary, however no other abdominal tenderness  Neurological:     Mental Status: She is alert.    MAU Course  Procedures  MDM Will repeat 07/10/2021 today> US shows that there is still a small mass on left ovary but it has decreased in size. Small amount of free fl  Long discussion between patient and Dr. [place] about surgery vs. Close follow-up. Given patient history of ectopic, oophorectomy and salpingectomy are appropriate options. Patient is undecided. She wants to see what her CBC is today.   1420: Patient's CBC  is 10.6, which is an increase from 10.   Assessment and Plan   1. Ectopic pregnancy of left ovary   2. Ectopic pregnancy    -patient elects to have surgery  in the near future, but not today. on Monday or Tuesday -Dr. Thursday at the bedside to review risks and benefits including complications, risks to fertility, recovery plan, pain management plan. Dr. Charlotta Patel will contact scheduler to schedule for Monday or Tuesday  -Strict ectopic precautions reviewed, including when to return to MAU between now and day of surgery -All questions answered.   Wednesday Carla Patel 07/11/2021, 11:54 AM

## 2021-07-11 NOTE — Addendum Note (Signed)
Addended by: Denyce Robert E on: 07/11/2021 11:44 AM   Modules accepted: Orders

## 2021-07-11 NOTE — MAU Note (Signed)
Presents stating she was sent from MD office for ultrasound d/t ectopic pregnancy.  Reports "a little bit" of VB.

## 2021-07-11 NOTE — Progress Notes (Signed)
GYNECOLOGY OFFICE VISIT NOTE  History:  35 y.o. [id number], history of two prior ectopic pregnancies, here today for continued left lower abdominal cramping and pain after diagnosis of ectopic pregnancy and treatment with methotrexate on 8/17.   Her hcg trends are as follows: Day 0 at [facility] (07/02/2021): 2085  Day 4 at [facility] (07/05/2021) 1710 Day 7 at [facility] in office (07/08/2021) 852   Reports cramping stopped the day she got MTX and then restarted soon after and has been constant. She had a similar pattern for her bleeding . Also reports some dysuria  The following portions of the patient's history were reviewed and updated as appropriate: allergies, current medications, past family history, past medical history, past social history, past surgical history and problem list.   Review of Systems:  Pertinent items noted in HPI Review of Systems  Constitutional:  Negative for chills and fever.  Respiratory:  Negative for shortness of breath.   Cardiovascular:  Negative for chest pain.  Gastrointestinal:  Positive for abdominal pain. Negative for nausea and vomiting.  Genitourinary:  Positive for dysuria.  Musculoskeletal:  Negative for back pain.  All other systems reviewed and are negative.  Objective:  Physical Exam BP (!) 146/91   Pulse (!) 101   Ht 5\' 8"  (1.727 m)   Wt 262 lb 3.2 oz (118.9 kg)   LMP 07/08/2021 (Exact Date) Comment: spotting  Breastfeeding No   BMI 39.87 kg/m  Physical Exam Vitals and nursing note reviewed. Exam conducted with a chaperone present.  Constitutional:      Appearance: Normal appearance.  HENT:     Head: Normocephalic.  Cardiovascular:     Pulses: Normal pulses.  Pulmonary:     Effort: Pulmonary effort is normal.  Abdominal:     Comments: TTP of left lower abdomen and pelvic area  Genitourinary:    Comments: Vaginal vault with dark red blood, no evidence of clots, no evidence of active bleeding. Cervix checked and was fingertip open on exam, and  thick and high Skin:    General: Skin is warm.     Capillary Refill: Capillary refill takes less than 2 seconds.  Neurological:     General: No focal deficit present.     Mental Status: She is alert.    Labs and Imaging Results for orders placed or performed in visit on 07/08/21 (from the past 168 hour(s))  Beta hCG quant (ref lab)   Collection Time: 07/08/21 10:41 AM  Result Value Ref Range   hCG Quant 852 mIU/mL  Results for orders placed or performed during the hospital encounter of 07/05/21 (from the past 168 hour(s))  hCG, quantitative, pregnancy   Collection Time: 07/05/21  3:40 PM  Result Value Ref Range   hCG, Beta Chain, Quant, S 1,710 (H) <5 mIU/mL   07/07/21 OB LESS THAN 14 WEEKS WITH OB TRANSVAGINAL  Result Date: 07/02/2021 CLINICAL DATA:  Vaginal bleeding. 1st trimester pregnancy of unknown anatomic location. EXAM: OBSTETRIC <14 WK 07/04/2021 AND TRANSVAGINAL OB US TECHNIQUE: Both transabdominal and transvaginal ultrasound examinations were performed for complete evaluation of the gestation as well as the maternal uterus, adnexal regions, and pelvic cul-de-sac. Transvaginal technique was performed to assess early pregnancy. COMPARISON:  06/11/2020 FINDINGS: Intrauterine gestational sac: None Maternal uterus/adnexae: Retroflexed uterus. Endometrial thickness measures 4 mm. Normal appearance of both ovaries. A small lesion is seen in the left adnexa which shows peripheral soft tissue density in central cystic area. This measures 3.4 x 1.4 x 1.8 cm and  appears separate from the left ovary. This is similar in appearance in size to previous study. No abnormal free fluid identified. IMPRESSION: No IUP visualized. 3.4 cm left adnexal lesion which appears separate from the ovary, and is highly suspicious for ectopic pregnancy. No evidence of hemoperitoneum. Critical Value/emergent results were called by telephone at the time of interpretation on 07/02/2021 at 3:43 pm to provider Rolita in [facility], who  verbally acknowledged these results. Electronically Signed   By: Danae Orleans M.D.   On: 07/02/2021 15:47    Assessment & Plan:  1. Left tubal pregnancy without intrauterine pregnancy Patient following up due to continual cramping and pain of left lower abdomen and pelvis after treatment of ectopic pregnancy with methotrexate. Ectopic diagnosed on 8/17 and measured 3.4cm in left adnexa. HCG dowtrended on day 4 and day 7.  - Given current symptoms redrew HCG today and was planning to send to [place] in clinic- however given Friday afternoon there are no appointments available. Hemodynamically stable at this time. -Discussed with 2nd attending on call (Dr Charlotta Newton) and will send patient into [facility] for ultrasound and further evaluation. - Patient expressed understanding of plan. Also counseled to not eat or drink on the way - Her last meal and drink was before midnight yesterday.   Total face-to-face time with patient: 25 minutes.  Over 50% of encounter was spent on counseling and coordination of care.  Warner Mccreedy, MD, MPH OB Fellow, [facility] for [facility], [facility]

## 2021-07-11 NOTE — Progress Notes (Signed)
At bedside to discuss management.  While pt is having pain on her left side, she doe not report that it is getting worse than it's been, states she is more just tired of being in pain.  No nausea or vomiting.  Tolerating gen diet.  Some light vaginal bleeding.  Pt notes h/o ectopic x2, both treated medically.  No other acute complaints.  Of note, she was considering a future pregnancy, but after this she is reconsidering.  She reports issues with her periods and jokingly stated she wanted it all taken out.  PSHx: 2019- laparoscopy, D&C- operative note reviewed- no adhesions or evidence of endometriosis C-section x 1  O: BP (!) 147/102   Pulse 72   Temp 98.2 F (36.8 C) (Oral)   Resp 18   Ht 5\' 8"  (1.727 m)   Wt 118.5 kg   LMP 07/08/2021 (Exact Date) Comment: spotting  SpO2 100%   BMI 39.72 kg/m   Gen: no acute distress Neuro: mood appropriate, normal behavior, alert & oriented CV: normal pulse rate Lungs: normal respiratory effort Abd: obese, soft, no rebound or guarding, minimal abdominal tenderness on left side Ext; No edema no calf tenderness  CBC Latest Ref Rng & Units 07/11/2021 07/02/2021 06/11/2020  WBC 4.0 - 10.5 K/uL 7.7 8.4 7.5  Hemoglobin 12.0 - 15.0 g/dL 10.6(L) 10.0(L) 13.7  Hematocrit 36.0 - 46.0 % 35.2(L) 32.6(L) 42.4  Platelets 150 - 400 K/uL 350 323 250     TVUS: FINDINGS: Intrauterine gestational sac: None   Maternal uterus/adnexae: Retroverted uterus. No evidence of an intrauterine gestational sac. Right ovary measures 1.6 x 1.2 x 0.8 cm and appears unremarkable. The left ovary measures 2.7 x 1.4 x 1.9 cm and appears unremarkable. Complex mass adjacent to but separate from the left ovary measures approximately 1.9 x 1.7 x 1.6 cm (previously measured up to 3.4 cm on 07/02/2021). No gestational sac or embryo seen within this paraovarian mass. No hyperemia within the mass. Small volume simple-appearing free fluid within the cul-de-sac.   IMPRESSION: 1.  Left adnexal soft tissue mass has decreased in size from the previous ultrasound now measuring 1.9 cm (previously 3.4 cm). Findings remain suspicious for ectopic pregnancy. No evidence to suggest a live ectopic. No evidence of rupture. 2. No evidence of a intrauterine pregnancy.  A/P 35yo 07/04/2021 with known ectopic pregnancy -Discussed management options- reviewed with patient that based on hcg level, clinical exam, imaging and lab work- findings are not suggestive of ruptured ectopic.   -Suspect that indeed MTX is working -Alternatively given option of surgical intervention based on pain and h/o prior ectopic. -Reviewed benefit/risk of surgery including but not limited to risk of bleeding, infection and injury to surrounding organs including bowel, bladder, uterus and ovaries.  Questions and concerns were addressed.  After much discussion, she would prefer to hold off on surgery today, but would like to proceed with surgery in the near future. -Discussed possibly scheduling early next week/next available.   -Also discussed that in the interval she should still watch out for signs of a ruptured ectopic and reviewed precautions. -Referral sent to S. [id number] and will work to arrange surgical management  Raynelle Jan, DO Attending Obstetrician & Gynecologist, [facility] for [facility]

## 2021-07-12 LAB — BETA HCG QUANT (REF LAB): hCG Quant: 212 m[IU]/mL

## 2021-07-14 ENCOUNTER — Telehealth: Payer: Self-pay | Admitting: *Deleted

## 2021-07-14 ENCOUNTER — Telehealth: Payer: Self-pay | Admitting: Obstetrics and Gynecology

## 2021-07-14 NOTE — Telephone Encounter (Signed)
Spoke to Carla Patel in regards to her surgery. She has elected not to have surgery at this time. Ectopic pregnancy precautions reviewed with pt. Will send note to front office to schedule follow up BHCG and appt with GYN MD provider to discuss possible hysterectomy at a later date.

## 2021-07-14 NOTE — Telephone Encounter (Signed)
Call to patient to review surgery plan. Surgery scheduled for tomorrow 8-31 at 1200 at [facility].  Patient states she is no longer hurting and is reconsidering surgery. Advised will have MD review and contact her to review options. Update provided to Dr Alysia Penna.

## 2021-07-15 ENCOUNTER — Other Ambulatory Visit: Payer: Medicaid Other

## 2021-07-15 ENCOUNTER — Encounter (HOSPITAL_COMMUNITY): Admission: RE | Payer: Self-pay | Source: Home / Self Care

## 2021-07-15 ENCOUNTER — Ambulatory Visit (HOSPITAL_COMMUNITY)
Admission: RE | Admit: 2021-07-15 | Payer: Medicaid Other | Source: Home / Self Care | Admitting: Obstetrics & Gynecology

## 2021-07-15 ENCOUNTER — Other Ambulatory Visit: Payer: Self-pay | Admitting: General Practice

## 2021-07-15 DIAGNOSIS — O009 Unspecified ectopic pregnancy without intrauterine pregnancy: Secondary | ICD-10-CM

## 2021-07-15 SURGERY — LAPAROSCOPY, DIAGNOSTIC
Anesthesia: Choice

## 2021-07-15 NOTE — Telephone Encounter (Signed)
DR Alysia Penna reviewed with patient and surgery was canceled.

## 2021-07-22 ENCOUNTER — Other Ambulatory Visit: Payer: Self-pay

## 2021-07-22 ENCOUNTER — Ambulatory Visit (INDEPENDENT_AMBULATORY_CARE_PROVIDER_SITE_OTHER): Payer: Medicaid Other | Admitting: Family Medicine

## 2021-07-22 ENCOUNTER — Encounter: Payer: Self-pay | Admitting: Family Medicine

## 2021-07-22 VITALS — BP 145/90 | HR 85 | Ht 68.0 in | Wt 265.4 lb

## 2021-07-22 DIAGNOSIS — O009 Unspecified ectopic pregnancy without intrauterine pregnancy: Secondary | ICD-10-CM

## 2021-07-22 DIAGNOSIS — N946 Dysmenorrhea, unspecified: Secondary | ICD-10-CM | POA: Insufficient documentation

## 2021-07-22 DIAGNOSIS — Z124 Encounter for screening for malignant neoplasm of cervix: Secondary | ICD-10-CM | POA: Insufficient documentation

## 2021-07-22 NOTE — Assessment & Plan Note (Signed)
Repeat hcg obtained today, has been downtrending appropriately to date. Stressed importance of following this value to normal range and no unprotected intercourse.

## 2021-07-22 NOTE — Assessment & Plan Note (Signed)
Long history of menorrhagia and dysmenorrhea on review of chart dating back to at least 2019 per [facility] records from [place]. Patient firm in her decision to seek definitive therapy for these issues and no longer desires more children after this most recent miscarriage. Discussed in general terms options of TVH vs laparascopic procedure, she prefers the latter. Patient's case reviewed briefly with Dr. Erin Fulling who is happy to see the patient for a consultation. Message sent to schedule her at [facility] office.

## 2021-07-22 NOTE — Progress Notes (Signed)
GYNECOLOGY OFFICE VISIT NOTE  History:   Carla Patel is a 35 y.o. [phone] here today for f/u of ectopic pregnancy.  Patient diagnosed with ectopic pregnancy on 07/02/2021 and had methotrexate therapy Appropriately declining hcg but returned to [facility] on 07/11/21 with worsening pain and bleeding, at that time US showed decrease in size of ectopic and patient elected to defer surgery Since then hcg has continued to decline  Today reports she is feeling better, no bleeding or abdominal pain Understands need to continue to trend hcg and ok with getting it rechecked today  Bigger concern for patient is her long hx of menorrhagia She reports multiple visits in the past and discussions around options She is not interested in hormonal IUD as she does not want to wait months for the device to take effect She is very interested in hysterectomy and would prefer a minimally invasive approach if possible She has had one prior cesarean Only other surgery was a diagnostic laparoscopy in 2019. The op note is available in epic and noted only a mild bladder adhesion to anterior uterine wall, otherwise unremarkable findings Review of most recent US shows retroflexed uterus, no enlargement or fibroids  Health Maintenance Due  Topic Date Due   HIV Screening  Never done   Hepatitis C Screening  Never done   TETANUS/TDAP  Never done   PAP SMEAR-Modifier  Never done    Past Medical History:  Diagnosis Date   Elevated BP    Fatigue    Miscarriage 11/14/2019   Pre-eclampsia    takes meds   Sciatica    Seasonal allergies     Past Surgical History:  Procedure Laterality Date   CESAREAN SECTION     KNEE ARTHROSCOPY     LAPAROSCOPY     Uterine   TONSILLECTOMY      The following portions of the patient's history were reviewed and updated as appropriate: allergies, current medications, past family history, past medical history, past social history, past surgical history and problem list.   Health  Maintenance:   Last pap: No results found for: DIAGPAP, HPV, HPVHIGH due  Last mammogram:  N/a    Review of Systems:  Pertinent items noted in HPI and remainder of comprehensive ROS otherwise negative.  Physical Exam:  BP (!) 145/90   Pulse 85   Ht 5\' 8"  (1.727 m)   Wt 265 lb 6.4 oz (120.4 kg)   LMP 07/07/2021 (Exact Date) Comment: spotting  Breastfeeding Unknown   BMI 40.35 kg/m  CONSTITUTIONAL: Well-developed, well-nourished female in no acute distress.  HEENT:  Normocephalic, atraumatic. External right and left ear normal. No scleral icterus.  NECK: Normal range of motion, supple, no masses noted on observation SKIN: No rash noted. Not diaphoretic. No erythema. No pallor. MUSCULOSKELETAL: Normal range of motion. No edema noted. NEUROLOGIC: Alert and oriented to person, place, and time. Normal muscle tone coordination.  PSYCHIATRIC: Normal mood and affect. Normal behavior. Normal judgment and thought content. RESPIRATORY: Effort normal, no problems with respiration noted   Labs and Imaging No results found for this or any previous visit (from the past 168 hour(s)). 07/09/2021 OB Transvaginal  Result Date: 07/11/2021 CLINICAL DATA:  known ectopic with left sided pain EXAM: TRANSVAGINAL OB ULTRASOUND TECHNIQUE: Transvaginal ultrasound was performed for complete evaluation of the gestation as well as the maternal uterus, adnexal regions, and pelvic cul-de-sac. COMPARISON:  Ultrasound 07/02/2021 FINDINGS: Intrauterine gestational sac: None Maternal uterus/adnexae: Retroverted uterus. No evidence of an intrauterine gestational  sac. Right ovary measures 1.6 x 1.2 x 0.8 cm and appears unremarkable. The left ovary measures 2.7 x 1.4 x 1.9 cm and appears unremarkable. Complex mass adjacent to but separate from the left ovary measures approximately 1.9 x 1.7 x 1.6 cm (previously measured up to 3.4 cm on 07/02/2021). No gestational sac or embryo seen within this paraovarian mass. No hyperemia  within the mass. Small volume simple-appearing free fluid within the cul-de-sac. IMPRESSION: 1. Left adnexal soft tissue mass has decreased in size from the previous ultrasound now measuring 1.9 cm (previously 3.4 cm). Findings remain suspicious for ectopic pregnancy. No evidence to suggest a live ectopic. No evidence of rupture. 2. No evidence of a intrauterine pregnancy. Electronically Signed   By: Duanne Guess D.O.   On: 07/11/2021 13:04   US OB LESS THAN 14 WEEKS WITH OB TRANSVAGINAL  Result Date: 07/02/2021 CLINICAL DATA:  Vaginal bleeding. 1st trimester pregnancy of unknown anatomic location. EXAM: OBSTETRIC <14 WK [place] AND TRANSVAGINAL OB US TECHNIQUE: Both transabdominal and transvaginal ultrasound examinations were performed for complete evaluation of the gestation as well as the maternal uterus, adnexal regions, and pelvic cul-de-sac. Transvaginal technique was performed to assess early pregnancy. COMPARISON:  06/11/2020 FINDINGS: Intrauterine gestational sac: None Maternal uterus/adnexae: Retroflexed uterus. Endometrial thickness measures 4 mm. Normal appearance of both ovaries. A small lesion is seen in the left adnexa which shows peripheral soft tissue density in central cystic area. This measures 3.4 x 1.4 x 1.8 cm and appears separate from the left ovary. This is similar in appearance in size to previous study. No abnormal free fluid identified. IMPRESSION: No IUP visualized. 3.4 cm left adnexal lesion which appears separate from the ovary, and is highly suspicious for ectopic pregnancy. No evidence of hemoperitoneum. Critical Value/emergent results were called by telephone at the time of interpretation on 07/02/2021 at 3:43 pm to provider Rolita in [facility], who verbally acknowledged these results. Electronically Signed   By: Danae Orleans M.D.   On: 07/02/2021 15:47      Assessment and Plan:   Problem List Items Addressed This Visit       Genitourinary   Dysmenorrhea    Long history of  menorrhagia and dysmenorrhea on review of chart dating back to at least 2019 per [facility] records from [place]. Patient firm in her decision to seek definitive therapy for these issues and no longer desires more children after this most recent miscarriage. Discussed in general terms options of TVH vs laparascopic procedure, she prefers the latter. Patient's case reviewed briefly with Dr. Erin Fulling who is happy to see the patient for a consultation. Message sent to schedule her at [facility] office.         Other   Ectopic pregnancy - Primary    Repeat hcg obtained today, has been downtrending appropriately to date. Stressed importance of following this value to normal range and no unprotected intercourse.       Relevant Orders   Beta hCG quant (ref lab)   Screening for cervical cancer    I recommended to patient we collect a pap smear today as she is several years overdue. She declined, prefers to have this done at time of her consultation with Dr. Erin Fulling.        Routine preventative health maintenance measures emphasized. Please refer to After Visit Summary for other counseling recommendations.   Return in about 2 weeks (around 08/05/2021) for with Dr. Erin Fulling for surgical consultation.    Total face-to-face time  with patient: 20 minutes.  Over 50% of encounter was spent on counseling and coordination of care.   Venora Maples, MD/MPH Attending Family Medicine Physician, [facility] for [facility], [facility]

## 2021-07-22 NOTE — Assessment & Plan Note (Signed)
I recommended to patient we collect a pap smear today as she is several years overdue. She declined, prefers to have this done at time of her consultation with Dr. Erin Fulling.

## 2021-07-23 ENCOUNTER — Telehealth: Payer: Self-pay

## 2021-07-23 LAB — BETA HCG QUANT (REF LAB): hCG Quant: 3 m[IU]/mL

## 2021-07-23 NOTE — Telephone Encounter (Signed)
Spoke with pt. Pt given results and recommendations per Dr Crissie Reese. Pt verrbalized understanding and agreeable to plan of care.   Judeth Cornfield, RN

## 2021-07-23 NOTE — Telephone Encounter (Signed)
-----   Message from Venora Maples, MD sent at 07/23/2021  8:11 AM EDT ----- PUL hcg trend complete, no further testing required Clinical pool please let patient know she no longer needs to return for repeat beta HCG (no mychart active)

## 2021-11-30 IMAGING — US US OB < 14 WEEKS - US OB TV
1 series · 15 of 28 positions shown · non-contrast
Comparison: None for this gestation

CLINICAL DATA: Vaginal bleeding in first trimester of pregnancy

EXAM:
OBSTETRIC <14 WK US AND TRANSVAGINAL OB US
TECHNIQUE: Both transabdominal and transvaginal ultrasound examinations were
performed for complete evaluation of the gestation as well as the
maternal uterus, adnexal regions, and pelvic cul-de-sac.
Transvaginal technique was performed to assess early pregnancy.

[Series 1: us ob < 14 weeks - us ob tv · 15 of 59 slices shown]
[im 1/59]
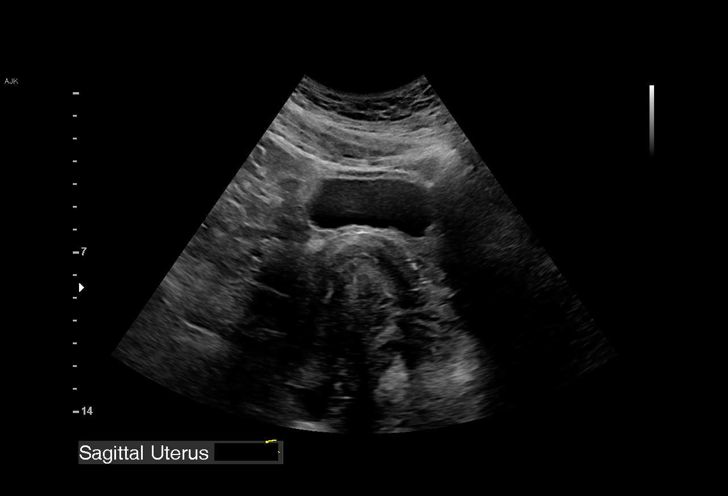
[im 5/59]
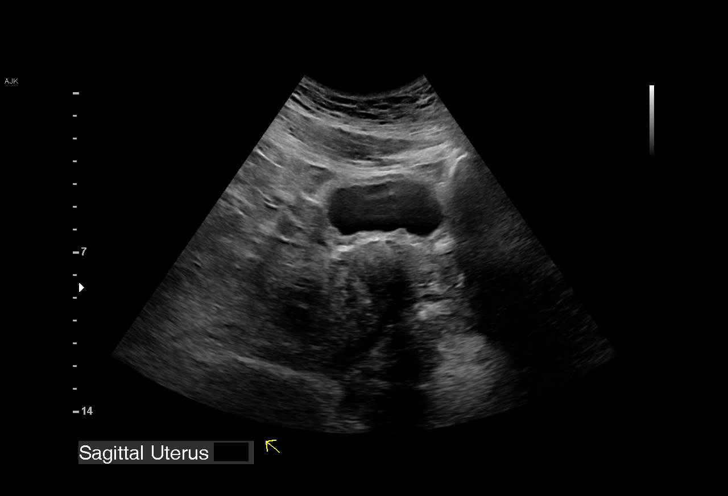
[im 9/59]
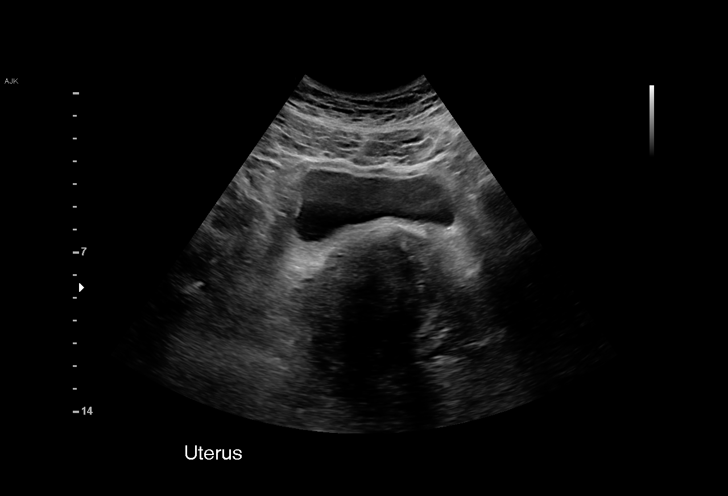
[im 13/59]
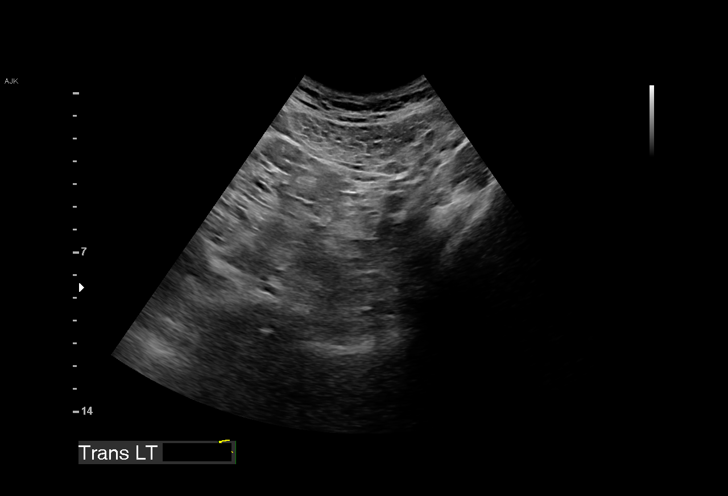
[im 18/59]
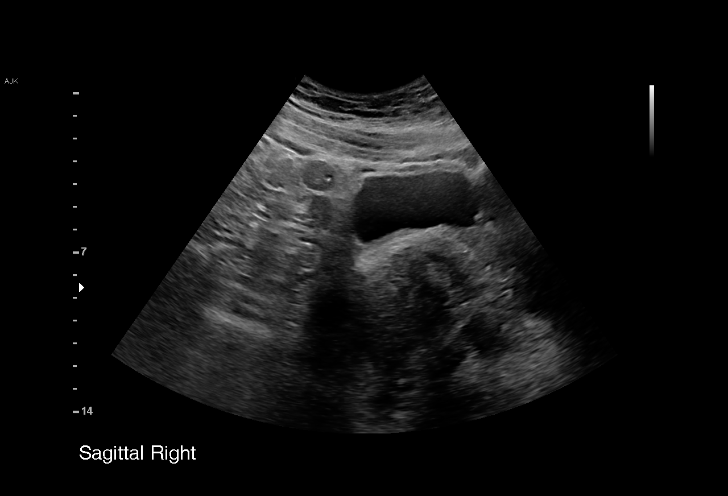
[im 22/59]
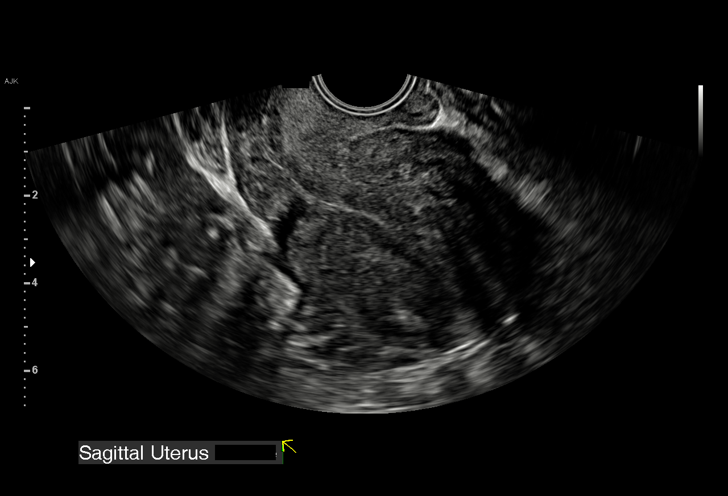
[im 26/59]
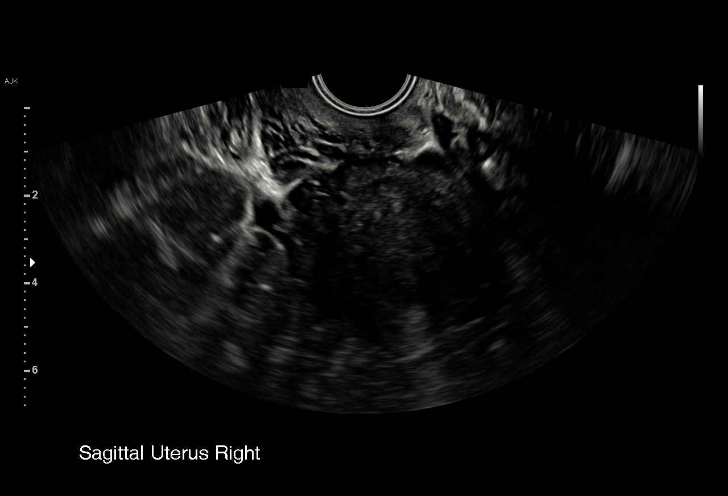
[im 31/59]
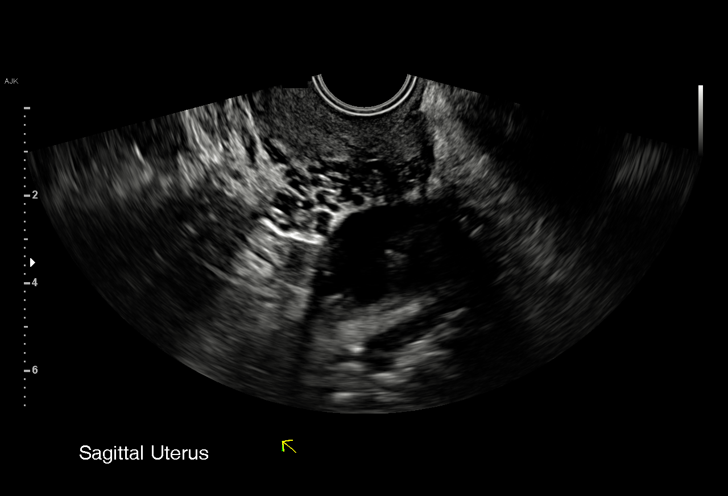
[im 33/59]
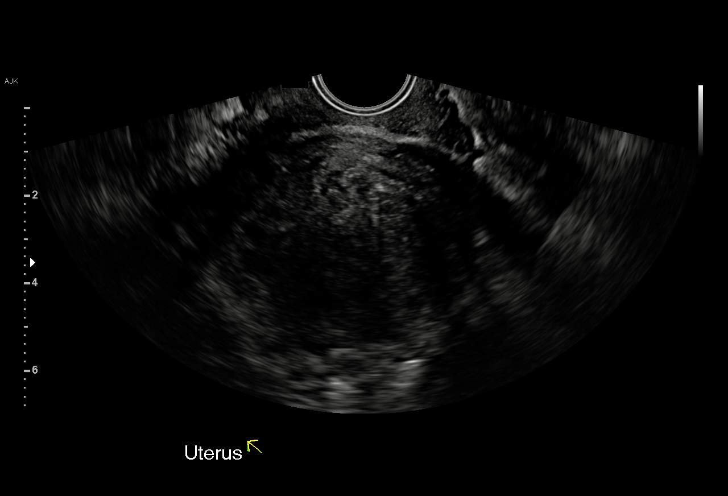
[im 37/59]
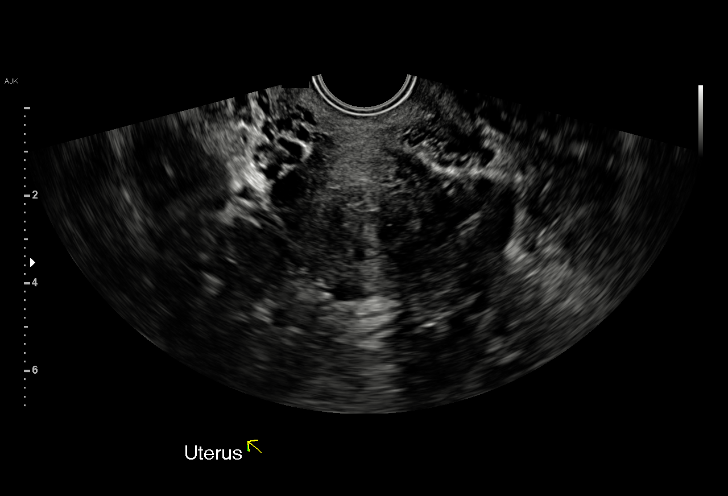
[im 41/59]
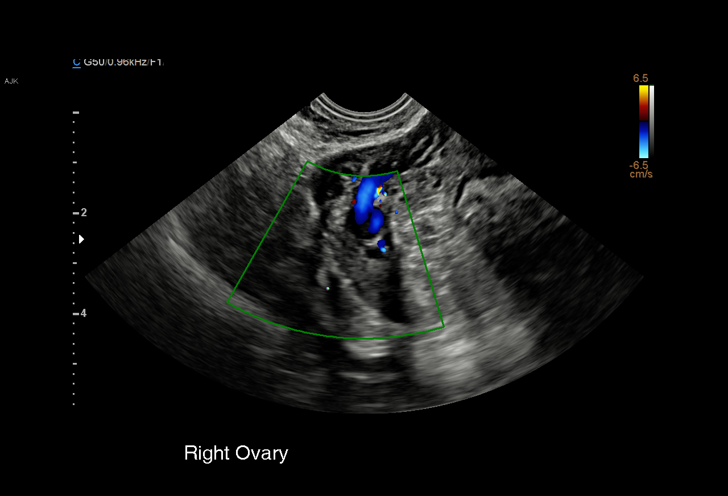
[im 46/59]
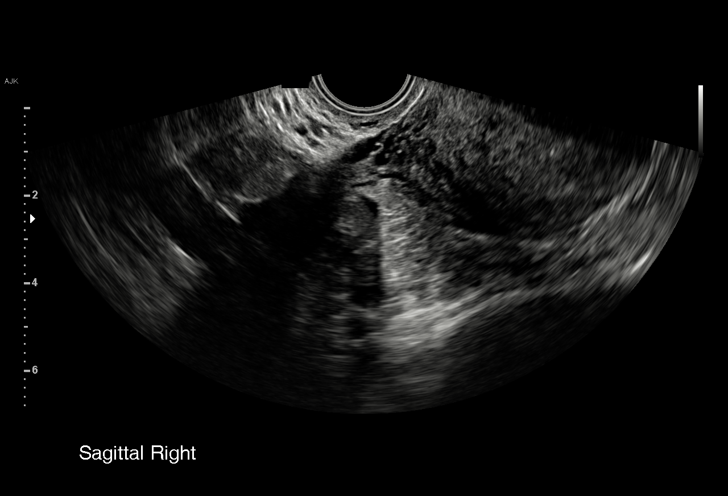
[im 50/59]
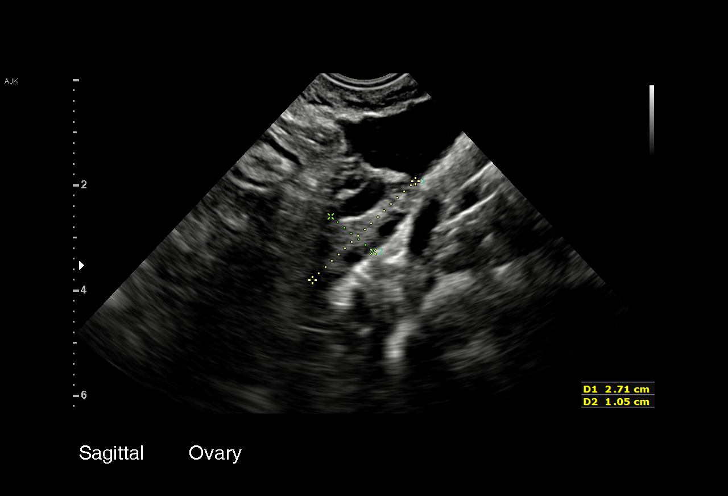
[im 54/59]
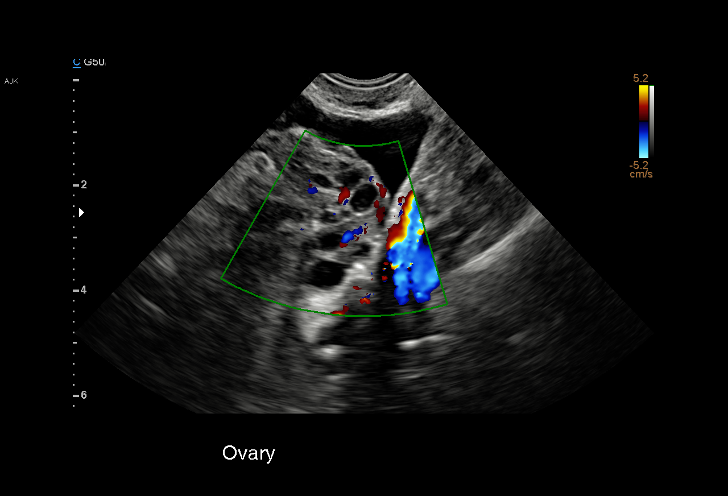
[im 59/59]
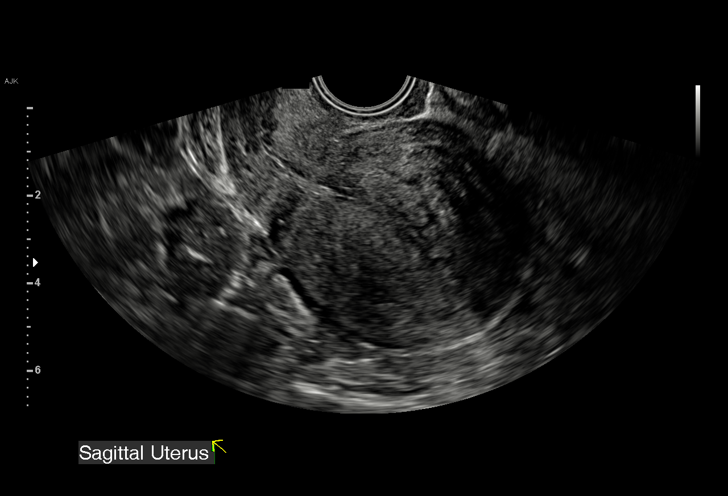

[15 of 28 positions shown; findings below may reference images not displayed]

FINDINGS: Intrauterine gestational sac: Absent

Yolk sac:  N/A

Embryo:  N/A

Cardiac Activity: N/A

Heart Rate: N/A  bpm

MSD:   mm    w     d

CRL:    mm    w    d                  US EDC:

Subchorionic hemorrhage:  N/A .

Maternal uterus/adnexae:

Retroverted uterus with mildly heterogeneous myometrium.

No uterine mass or gestational sac identified.

Endometrial complex unremarkable without fluid or gestational sac.

RIGHT ovary normal size and morphology, 1.3 x 2.0 x 1.3 cm.

LEFT ovary normal size and morphology, 2.7 x 1.1 x 1.5 cm.

Trace free pelvic fluid.

No adnexal masses.
IMPRESSION: No intrauterine gestation identified.

Findings are compatible with pregnancy of unknown location.

Differential diagnosis includes early intrauterine pregnancy too
early to visualize, spontaneous abortion, and ectopic pregnancy.

Serial quantitative beta hCG and or followup ultrasound recommended
to definitively exclude ectopic pregnancy.

## 2022-05-09 ENCOUNTER — Ambulatory Visit (HOSPITAL_COMMUNITY)
Admission: EM | Admit: 2022-05-09 | Discharge: 2022-05-09 | Disposition: A | Discharge status: Home / Self Care | Payer: Medicaid Other | Attending: Emergency Medicine | Admitting: Emergency Medicine

## 2022-05-09 ENCOUNTER — Encounter (HOSPITAL_COMMUNITY): Payer: Self-pay

## 2022-05-09 DIAGNOSIS — M545 Low back pain, unspecified: Secondary | ICD-10-CM | POA: Diagnosis not present

## 2022-05-09 DIAGNOSIS — S61210A Laceration without foreign body of right index finger without damage to nail, initial encounter: Secondary | ICD-10-CM | POA: Diagnosis not present

## 2022-05-09 MED ORDER — KETOROLAC TROMETHAMINE 30 MG/ML IJ SOLN
INTRAMUSCULAR | Status: AC
  Filled 2022-05-09: qty 1

## 2022-05-09 MED ORDER — TETANUS-DIPHTH-ACELL PERTUSSIS 5-2.5-18.5 LF-MCG/0.5 IM SUSY
PREFILLED_SYRINGE | INTRAMUSCULAR | Status: AC
  Filled 2022-05-09: qty 0.5

## 2022-05-09 MED ORDER — KETOROLAC TROMETHAMINE 30 MG/ML IJ SOLN
30.0000 mg | Freq: Once | INTRAMUSCULAR | Status: AC
  Administered 2022-05-09: 30 mg via INTRAMUSCULAR

## 2022-05-09 MED ORDER — NAPROXEN SODIUM 550 MG PO TABS: 550.0000 mg | ORAL_TABLET | Freq: Two times a day (BID) | ORAL | 0 refills | Status: AC

## 2022-05-09 MED ORDER — CYCLOBENZAPRINE HCL 10 MG PO TABS: 10.0000 mg | ORAL_TABLET | Freq: Two times a day (BID) | ORAL | 0 refills | Status: AC | PRN

## 2022-05-09 MED ORDER — TETANUS-DIPHTH-ACELL PERTUSSIS 5-2.5-18.5 LF-MCG/0.5 IM SUSY
0.5000 mL | PREFILLED_SYRINGE | Freq: Once | INTRAMUSCULAR | Status: AC
  Administered 2022-05-09: 0.5 mL via INTRAMUSCULAR

## 2022-05-09 NOTE — ED Triage Notes (Signed)
Onset 2 ago with lower pain. Pt reports no recent falls or injuries to her back.  Pt reports cutting her first on right hand this morning. She is concern about infection. Last TDAP-unknown

## 2022-05-09 NOTE — ED Provider Notes (Signed)
[facility]    CSN: [id number] Arrival date & time: 05/09/22  1008      History   Chief Complaint Chief Complaint  Patient presents with   Back Pain   Laceration    HPI Carla Patel is a 36 y.o. female.   Patient presents with laceration to the right index finger beginning this morning after cutting the finger on a boat prior to holding the door.  There is a small laceration to the anterior and posterior aspect of the finger.  Cleaned with water and wrapped in bandage then came straight to urgent care.  Range of motion of fingers intact but pain is elicited with flexion.  Denies numbness or tingling.  Unknown tetanus status.   Presents with right lower back pain beginning 2 days ago.  Symptoms began after getting out of bed.   are worsened with turning ,change of position. with bending she can feel a sharp shooting pain.  Pain is constant and described as a aching.  History of a herniated disc.  Has attempted use of ibuprofen once which was ineffective.  Denies numbness, tingling, urinary or bowel incontinence. Past Medical History:  Diagnosis Date   Elevated BP    Fatigue    Miscarriage 11/14/2019   Pre-eclampsia    takes meds   Sciatica    Seasonal allergies     Patient Active Problem List   Diagnosis Date Noted   Dysmenorrhea 07/22/2021   Screening for cervical cancer 07/22/2021   Ectopic pregnancy 07/11/2021   Microcytic hypochromic anemia 04/03/2020   Iron deficiency anemia due to chronic blood loss 04/03/2020   Pre-diabetes 03/14/2020   OSA (obstructive sleep apnea) 02/07/2020   Menorrhagia with regular cycle 07/27/2018   Essential (primary) hypertension 03/27/2016    Past Surgical History:  Procedure Laterality Date   CESAREAN SECTION     KNEE ARTHROSCOPY     LAPAROSCOPY     Uterine   TONSILLECTOMY      OB History     Gravida  4   Para  1   Term  1   Preterm      AB  3   Living  1      SAB  1   IAB      Ectopic  2    Multiple  0   Live Births  1            Home Medications    Prior to Admission medications   Medication Sig Start Date End Date Taking? Authorizing Provider  amLODipine (NORVASC) 5 MG tablet Take 5 mg by mouth daily.    [provider]    Family History Family History  Problem Relation Age of Onset   Healthy Mother    Healthy Father     Social History Social History   Tobacco Use   Smoking status: Former    Types: Cigars   Smokeless tobacco: Never  Vaping Use   Vaping Use: Never used  Substance Use Topics   Alcohol use: Not Currently    Alcohol/week: 0.0 standard drinks of alcohol   Drug use: Never     Allergies   Patient has no known allergies.   Review of Systems Review of Systems  Constitutional: Negative.   Respiratory: Negative.    Cardiovascular: Negative.   Gastrointestinal: Negative.   Musculoskeletal:  Positive for back pain. Negative for arthralgias, gait problem, joint swelling, myalgias, neck pain and neck stiffness.  Skin:  Positive for  wound. Negative for color change, pallor and rash.  Neurological: Negative.      Physical Exam Triage Vital Signs ED Triage Vitals  Enc Vitals Group     BP      Pulse      Resp      Temp      Temp src      SpO2      Weight      Height      Head Circumference      Peak Flow      Pain Score      Pain Loc      Pain Edu?      Excl. in GC?    No data found.  Updated Vital Signs LMP 07/07/2021 (Exact Date) Comment: spotting  Visual Acuity Right Eye Distance:   Left Eye Distance:   Bilateral Distance:    Right Eye Near:   Left Eye Near:    Bilateral Near:     Physical Exam Constitutional:      Appearance: Normal appearance.  HENT:     Head: Normocephalic.  Eyes:     Extraocular Movements: Extraocular movements intact.  Pulmonary:     Effort: Pulmonary effort is normal.  Musculoskeletal:     Comments: Tenderness is present over the right lower lumbar region extending  slightly into the buttocks, no ecchymosis, swelling or deformity noted, range of motion intact but pain is elicited when bending and when changing from a sitting to standing position, able to bear weight, straight leg test negative  Skin:    Comments: Less than 1 cm laceration present over the middle phalanx of the right index finger on the palmar aspect, bleeding has subsided  Less than 0.5 cm laceration present to the middle phalanx of the right index finger on the posterior aspect ,bleeding has subsided  Neurological:     Mental Status: She is alert and oriented to person, place, and time. Mental status is at baseline.  Psychiatric:        Mood and Affect: Mood normal.        Behavior: Behavior normal.      UC Treatments / Results  Labs (all labs ordered are listed, but only abnormal results are displayed) Labs Reviewed - No data to display  EKG   Radiology No results found.  Procedures Laceration Repair  Date/Time: 05/09/2022 4:15 PM  Performed by: Valinda Hoar, NP Authorized by: Valinda Hoar, NP   Consent:    Consent obtained:  Verbal   Consent given by:  Patient   Risks discussed:  Infection and pain Universal protocol:    Procedure explained and questions answered to patient or proxy's satisfaction: yes     Patient identity confirmed:  Verbally with patient Anesthesia:    Anesthesia method:  Topical application   Topical anesthesia: Lidocaine mist. Laceration details:    Location:  Finger   Finger location:  R index finger   Wound length (cm): 0.5 cm to the posterior of the middle phalanx, 0.5 cm to the anterior of the middle phalanx. Pre-procedure details:    Preparation:  Patient was prepped and draped in usual sterile fashion Exploration:    Limited defect created (wound extended): yes   Treatment:    Area cleansed with:  Chlorhexidine   Amount of cleaning:  Standard Skin repair:    Repair method:  Tissue adhesive Approximation:     Approximation:  Close Repair type:    Repair type:  Simple  Post-procedure details:    Dressing:  Non-adherent dressing   Procedure completion:  Tolerated  (including critical care time)  Medications Ordered in UC Medications - No data to display  Initial Impression / Assessment and Plan / UC Course  I have reviewed the triage vital signs and the nursing notes.  Pertinent labs & imaging results that were available during my care of the patient were reviewed by me and considered in my medical decision making (see chart for details).  Patient of right index finger without foreign body without damage to nail, initial encounter Acute right-sided low back pain without sciatica  Laceration was repaired with skin adhesive, tolerated well, advised patient to keep area dry for the next 24 hours, covered with a nonadherent dressing prior to discharge, may cleanse daily with normal use with diluted soapy water, patting dry and cover if at risk for contamination, advised patient to watch for signs of infection he is to return urgent care immediately if that occurs, tetanus injection given in office, may use Tylenol and ibuprofen as needed for management of discomfort, may follow-up with urgent care  Etiology of back pain is most likely muscular, Toradol injection given in office, prescribed naproxen and Flexeril for outpatient management, recommended RICE, heat, pillows for support, daily stretching and activity as, given walker referral to orthopedics if symptoms continue to persist or worsen Final Clinical Impressions(s) / UC Diagnoses   Final diagnoses:  None   Discharge Instructions   None    ED Prescriptions   None    PDMP not reviewed this encounter.   Valinda Hoar, NP 05/09/22 1616

## 2022-07-13 ENCOUNTER — Other Ambulatory Visit: Payer: Self-pay | Admitting: Obstetrics and Gynecology

## 2022-07-13 DIAGNOSIS — O009 Unspecified ectopic pregnancy without intrauterine pregnancy: Secondary | ICD-10-CM

## 2022-07-25 IMAGING — US US OB < 14 WEEKS - US OB TV
1 series · 15 of 28 positions shown · non-contrast
Comparison: None.

CLINICAL DATA: Left lower quadrant pain for 1 week. Gestational age
by LMP of 4 weeks 3 days.

EXAM:
OBSTETRIC <14 WK US AND TRANSVAGINAL OB US
TECHNIQUE: Both transabdominal and transvaginal ultrasound examinations were
performed for complete evaluation of the gestation as well as the
maternal uterus, adnexal regions, and pelvic cul-de-sac.
Transvaginal technique was performed to assess early pregnancy.

[Series 1: us ob < 14 weeks - us ob tv · 66 acquisitions, 15 frames shown]
[im 1/66]
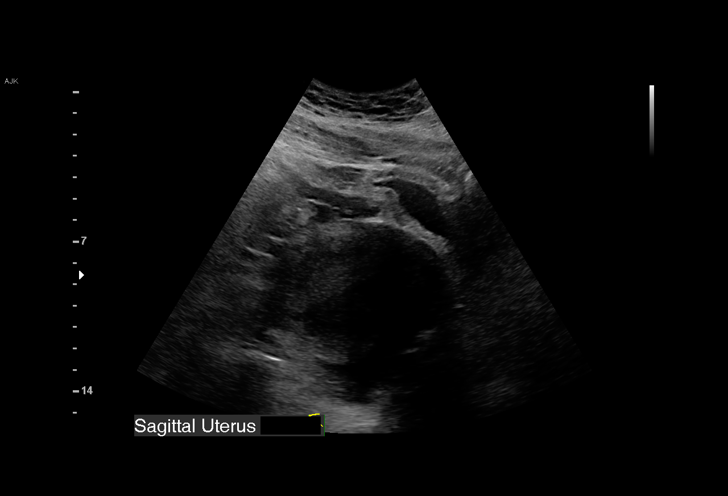
[im 5/66]
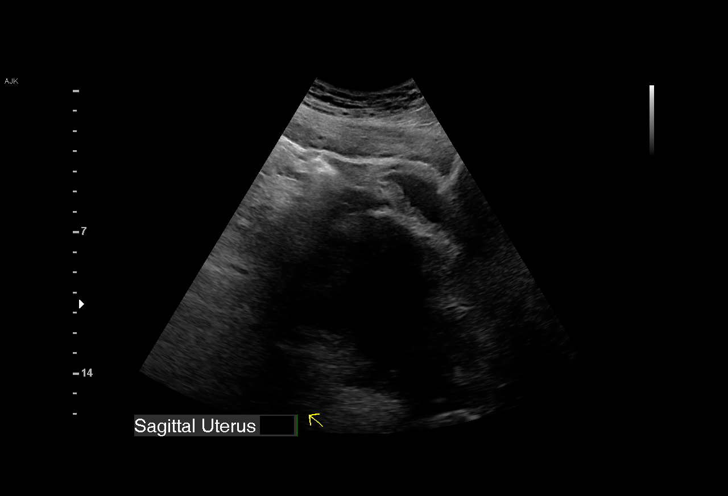
[im 10/66]
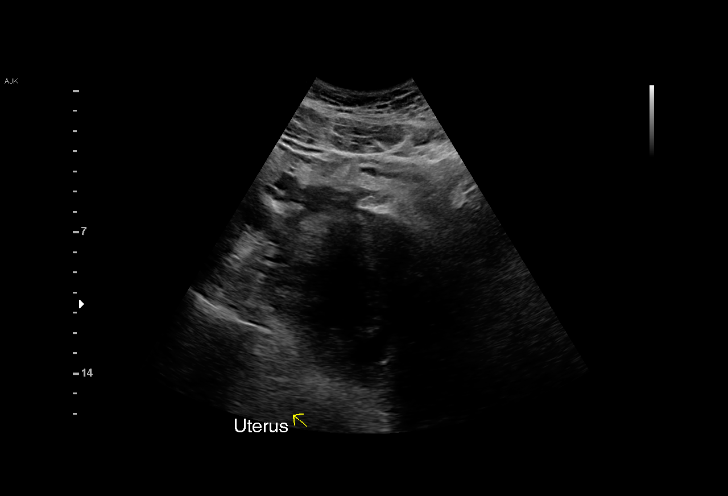
[im 15/66]
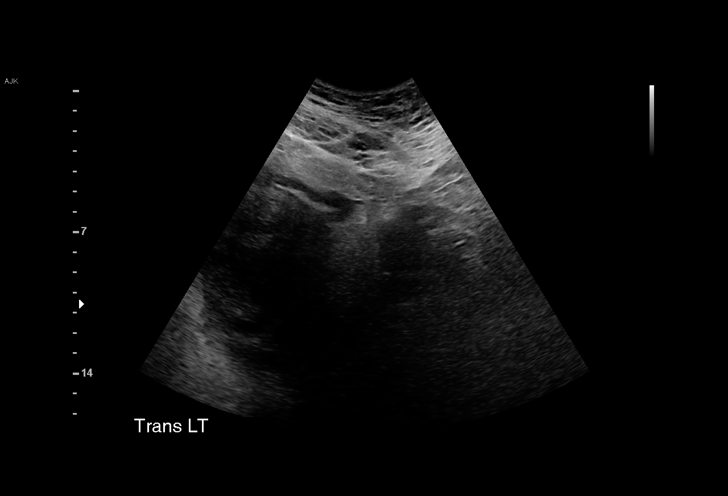
[im 20/66]
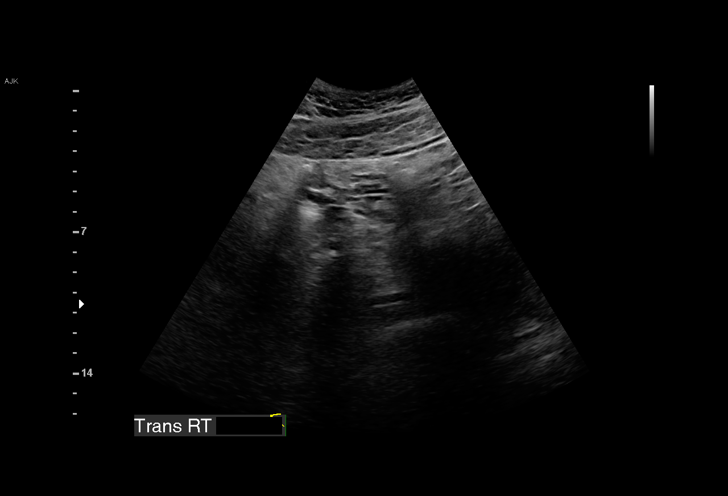
[im 25/66]
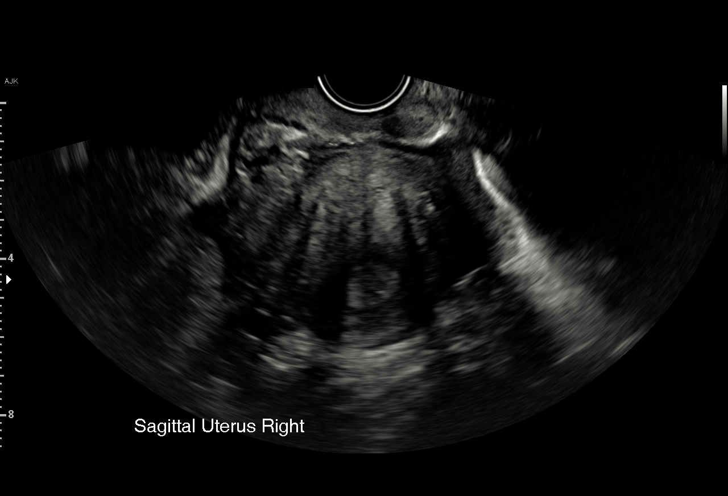
[im 29/66]
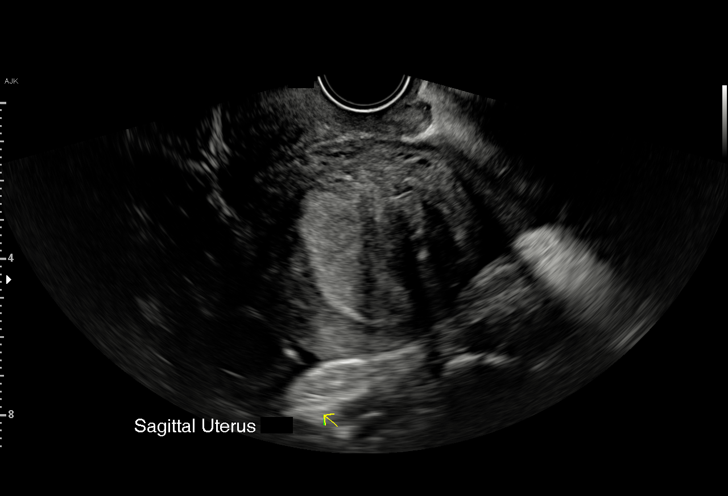
[im 34/66]
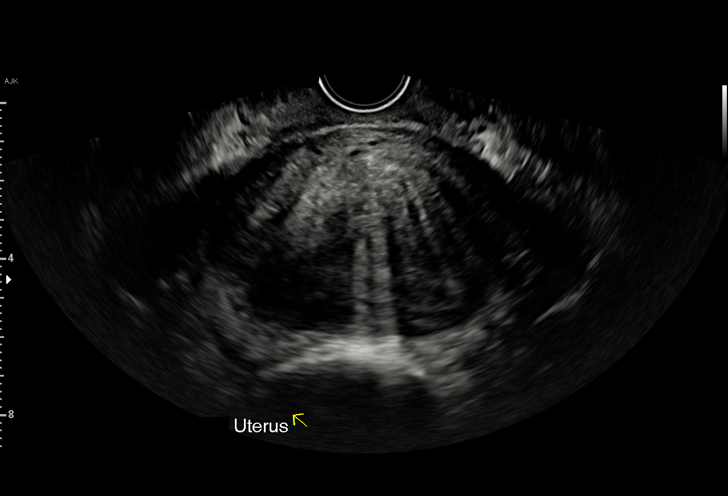
[im 37/66]
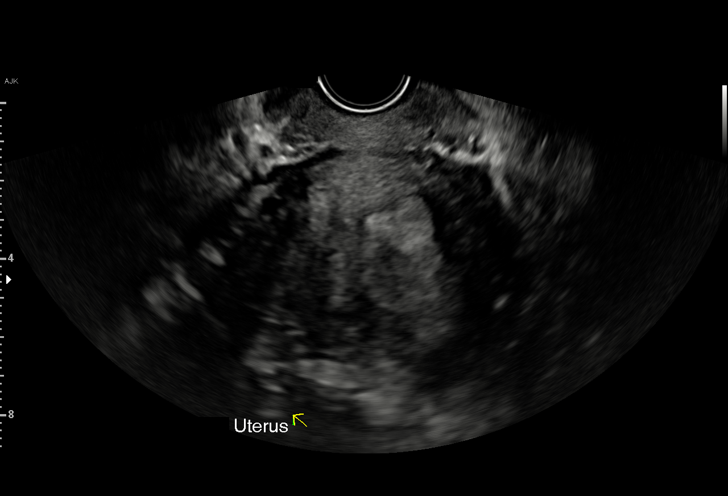
[im 41/66]
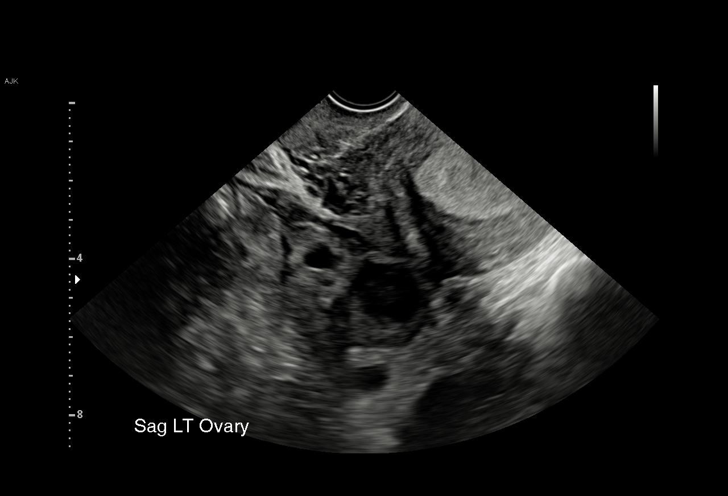
[im 46/66]
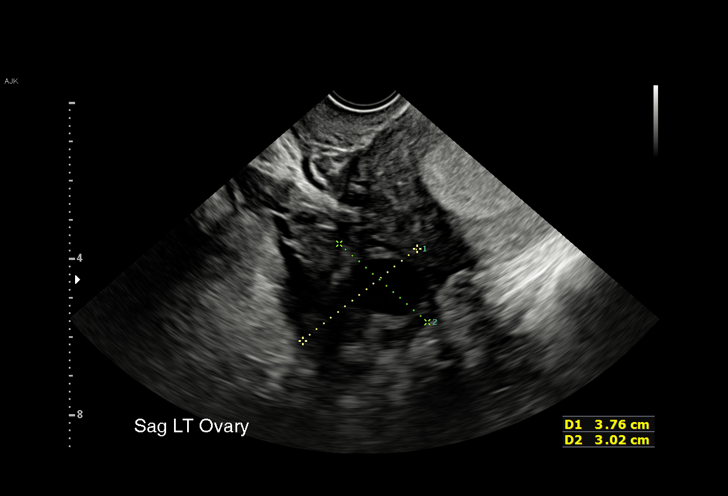
[im 51/66]
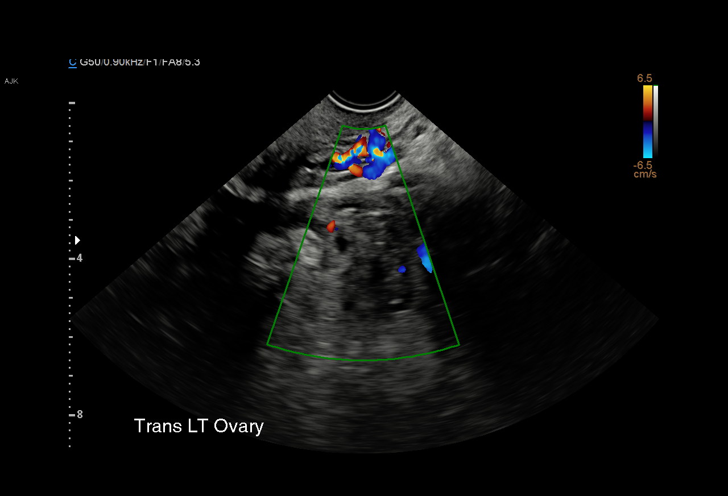
[im 56/66]
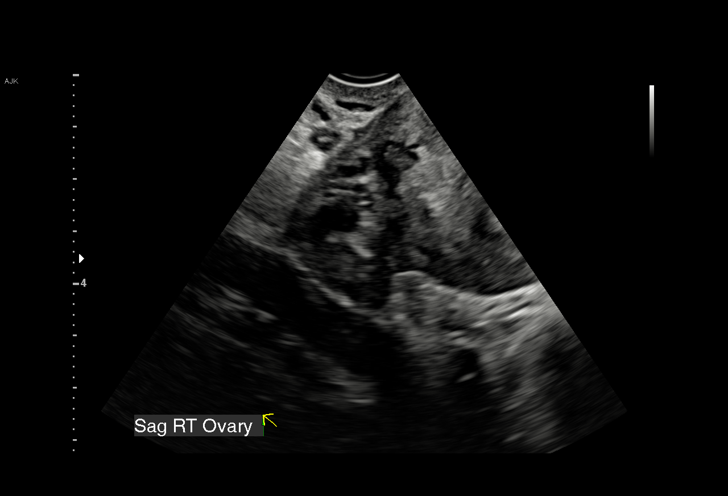
[im 61/66]
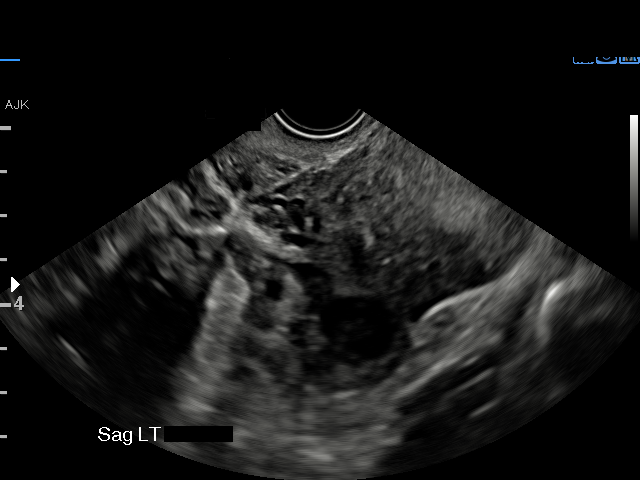
[im 66/66]
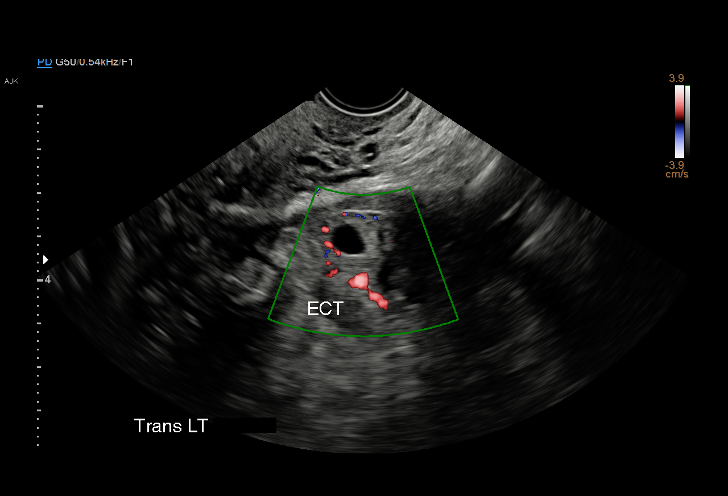

[15 of 28 positions shown; findings below may reference images not displayed]

FINDINGS: Intrauterine gestational sac: None

Maternal uterus/adnexae: Uterus is retroflexed. Endometrial
thickness measures 19 mm. No fibroids identified.

The right ovary is normal appearance. A small cystic area with
hyperechoic rim is seen along the peripheral aspect of the left
ovary, which cannot be separated from the ovary. This measures 2.2 x
1.8 x 1.8 cm. Differential diagnosis includes an ectopic gestational
sac versus a corpus luteum in the periphery of the left ovary. Tiny
amount of simple free fluid is noted in the pelvic cul-de-sac.
IMPRESSION: No IUP visualized.

2.2 cm thick-walled cystic structure along the periphery of the left
ovary is indeterminate, and could represent an ectopic gestational
sac or a corpus luteum within the periphery of the left ovary.
Recommend correlation with quantitative hCG level, and consider
short-term follow-up by ultrasound if clinically warranted.

Small amount of simple free fluid.

Critical Value/emergent results were called by telephone at the time
of interpretation on 06/11/2020 at [DATE] to provider NIZ
ADLAHO , who verbally acknowledged these results.

## 2023-07-01 ENCOUNTER — Institutional Professional Consult (permissible substitution): Payer: Medicaid Other | Admitting: Nurse Practitioner

## 2023-07-21 ENCOUNTER — Institutional Professional Consult (permissible substitution): Payer: Medicaid Other | Admitting: Nurse Practitioner

## 2023-08-24 IMAGING — US US OB TRANSVAGINAL
1 series · 15 of 28 positions shown · non-contrast
Comparison: Ultrasound 07/02/2021

CLINICAL DATA: known ectopic with left sided pain

EXAM:
TRANSVAGINAL OB ULTRASOUND
TECHNIQUE: Transvaginal ultrasound was performed for complete evaluation of the
gestation as well as the maternal uterus, adnexal regions, and
pelvic cul-de-sac.

[Series 1: us ob transvaginal · 34 acquisitions, 15 frames shown]
[im 1/34]
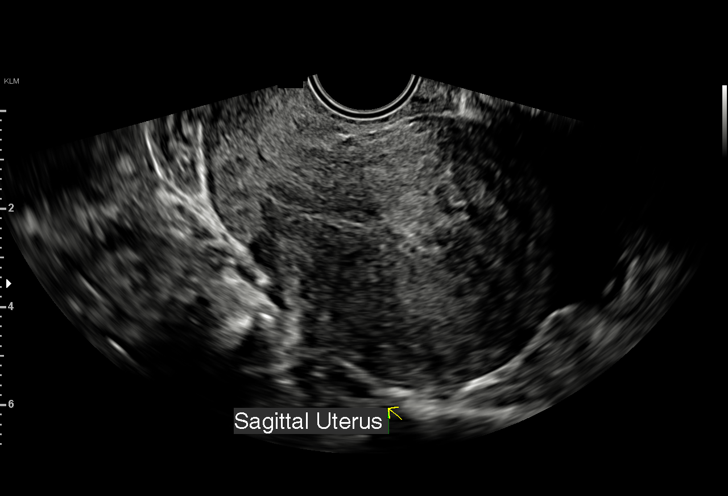
[im 3/34]
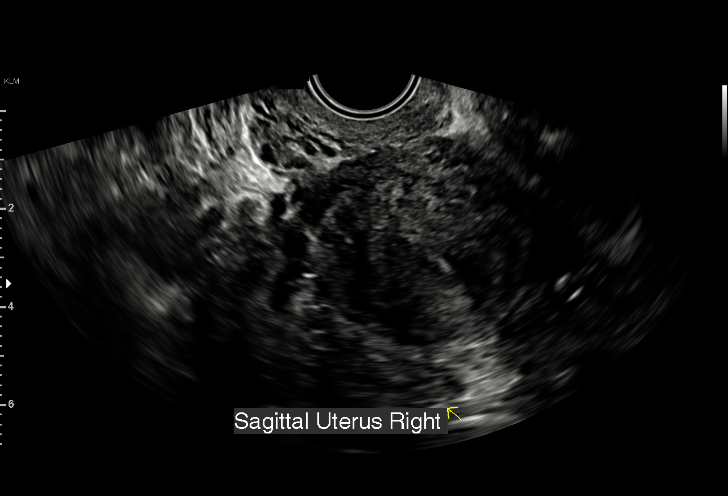
[im 5/34]
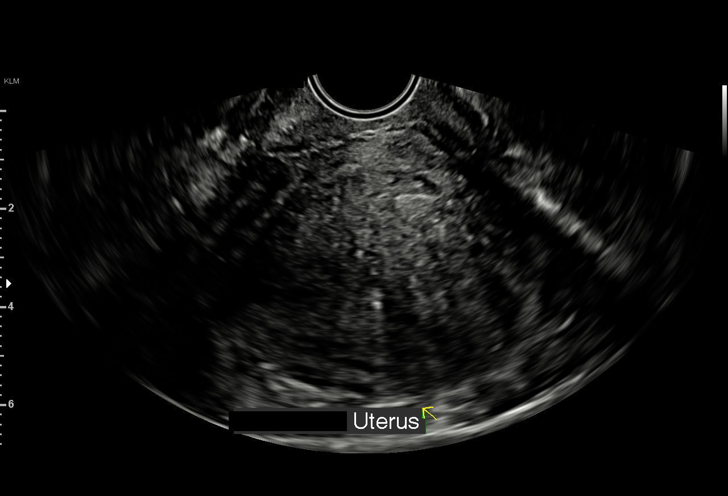
[im 8/34]
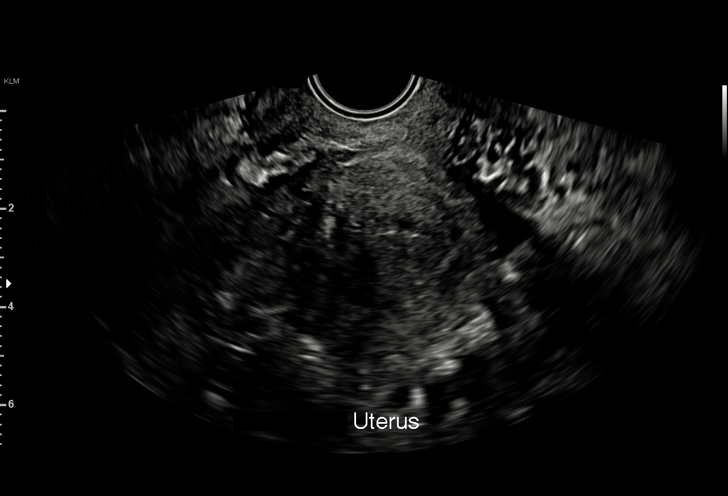
[im 10/34]
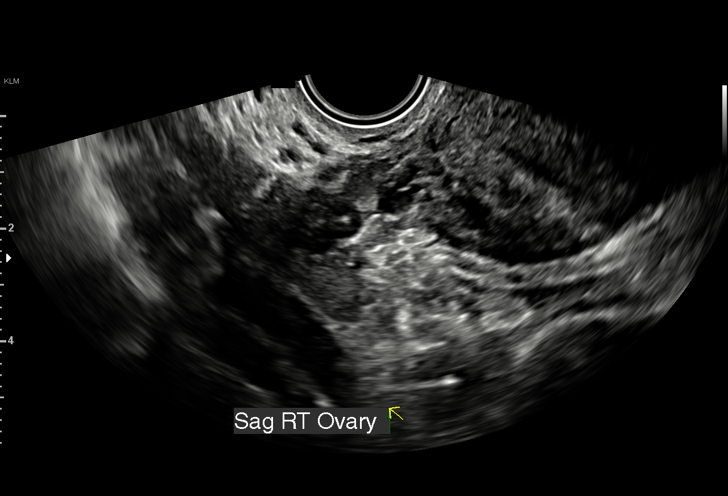
[im 13/34]
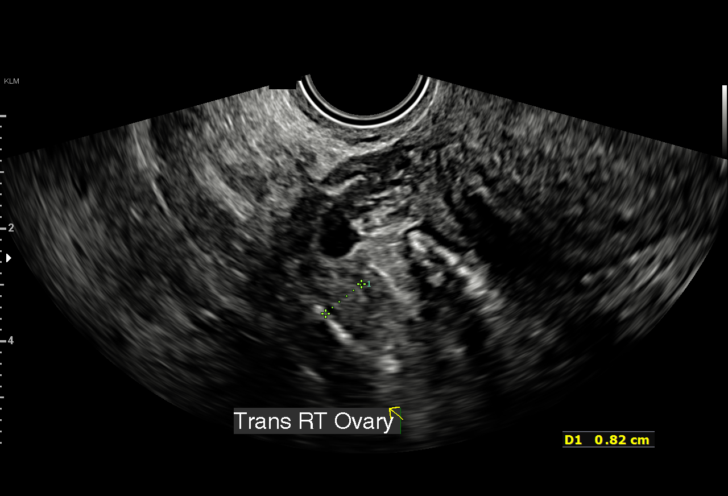
[im 15/34]
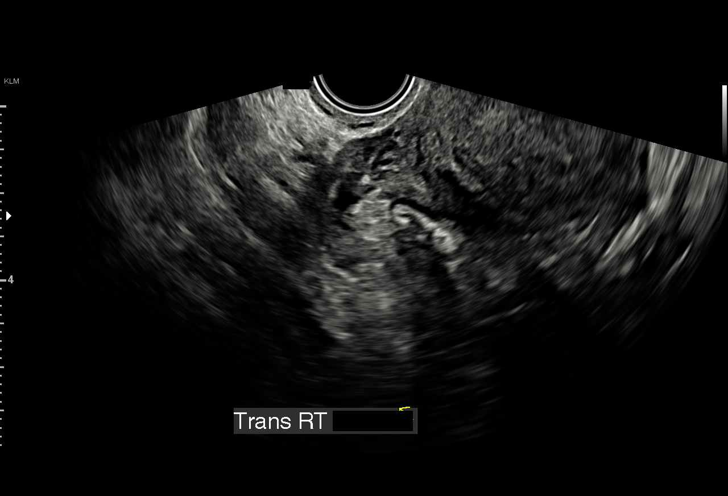
[im 18/34]
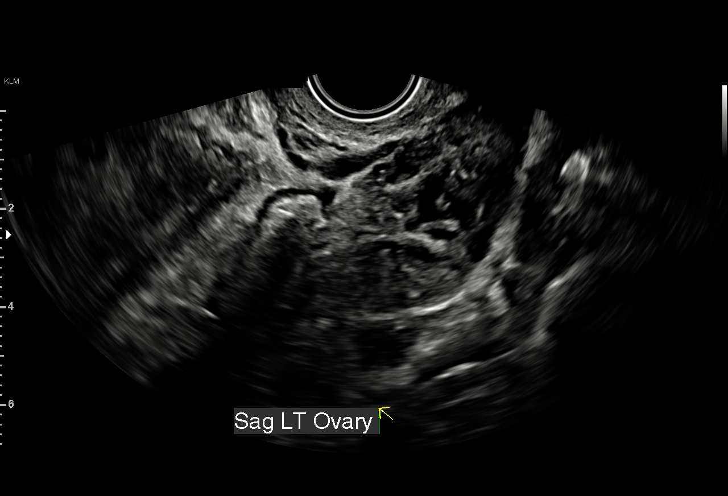
[im 19/34]
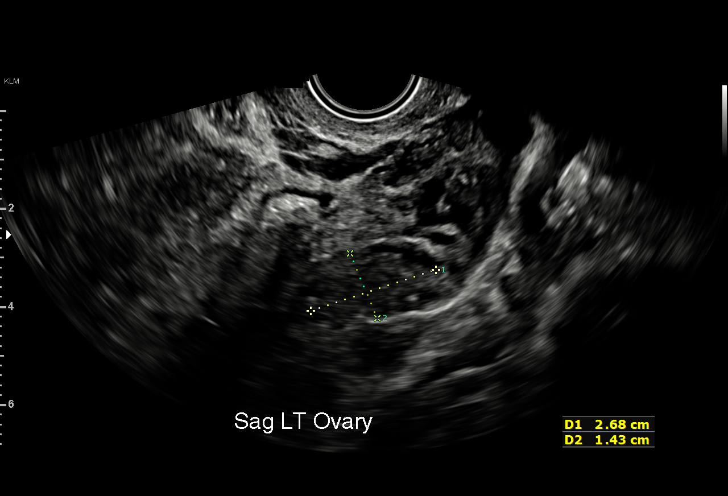
[im 21/34]
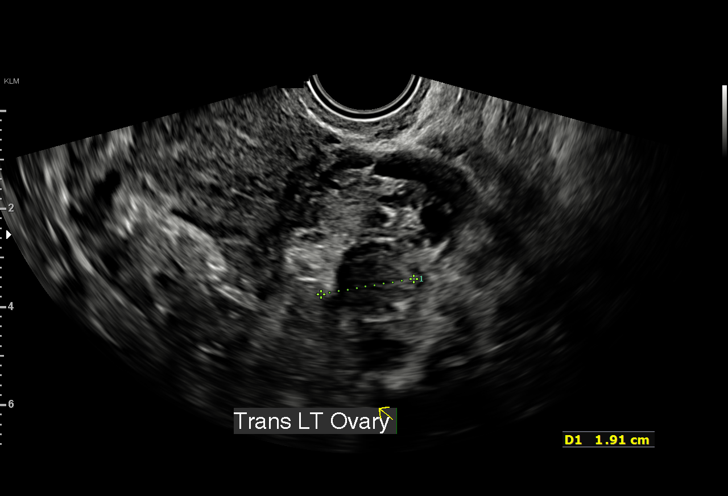
[im 24/34]
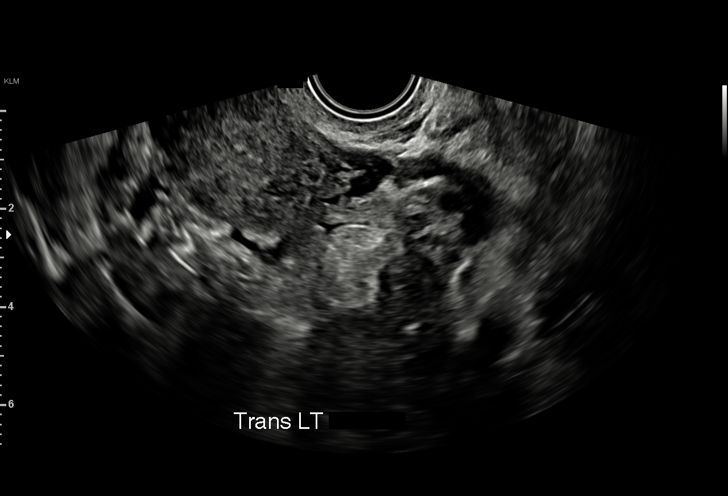
[im 26/34]
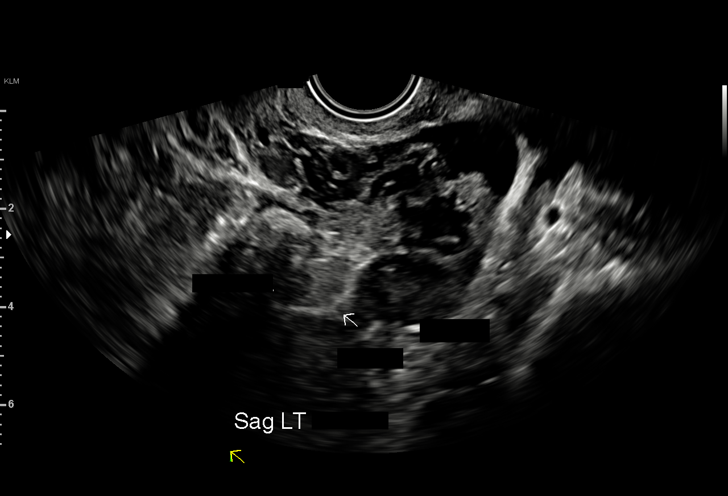
[im 29/34]
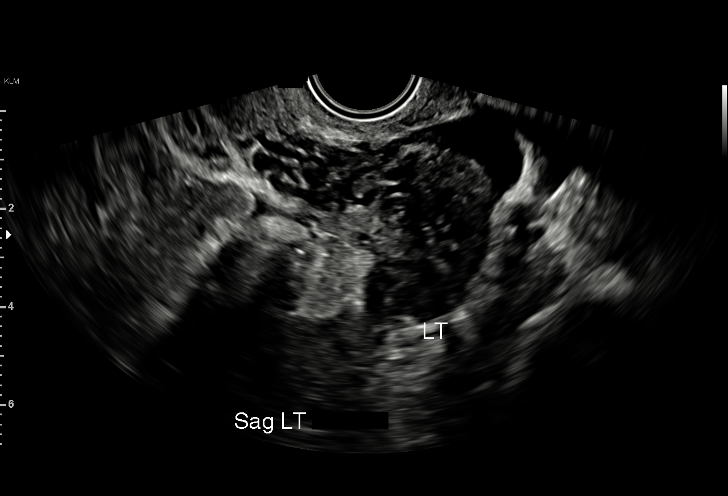
[im 31/34]
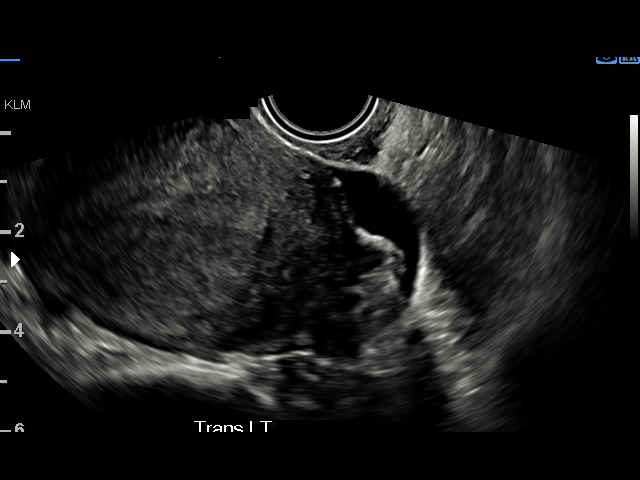
[im 34/34]
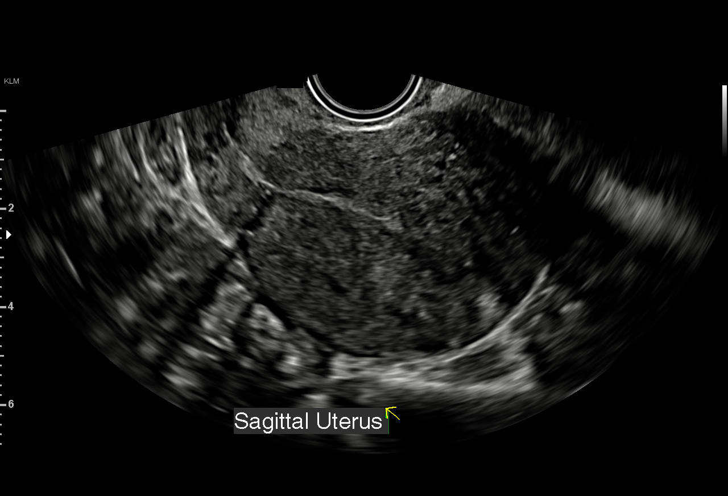

[15 of 28 positions shown; findings below may reference images not displayed]

FINDINGS: Intrauterine gestational sac: None

Maternal uterus/adnexae: Retroverted uterus. No evidence of an
intrauterine gestational sac. Right ovary measures 1.6 x 1.2 x
cm and appears unremarkable. The left ovary measures 2.7 x 1.4 x
cm and appears unremarkable. Complex mass adjacent to but separate
from the left ovary measures approximately 1.9 x 1.7 x 1.6 cm
(previously measured up to 3.4 cm on 07/02/2021). No gestational sac
or embryo seen within this paraovarian mass. No hyperemia within the
mass. Small volume simple-appearing free fluid within the
cul-de-sac.
IMPRESSION: 1. Left adnexal soft tissue mass has decreased in size from the
previous ultrasound now measuring 1.9 cm (previously 3.4 cm).
Findings remain suspicious for ectopic pregnancy. No evidence to
suggest a live ectopic. No evidence of rupture.
2. No evidence of a intrauterine pregnancy.

## 2023-09-08 ENCOUNTER — Institutional Professional Consult (permissible substitution): Payer: Medicaid Other | Admitting: Nurse Practitioner

## 2025-05-09 ENCOUNTER — Ambulatory Visit: Admitting: Dermatology
